# Patient Record
Sex: Female | Born: 1970 | Race: Black or African American | Hispanic: No | Marital: Single | Smoking: Never smoker
Health system: Southern US, Community
[De-identification: ages and names within clinical notes are randomized; demographics above are authoritative.]

## PROBLEM LIST (undated history)

## (undated) DIAGNOSIS — E0500 Thyrotoxicosis with diffuse goiter without thyrotoxic crisis or storm: Secondary | ICD-10-CM

## (undated) DIAGNOSIS — I10 Essential (primary) hypertension: Secondary | ICD-10-CM

## (undated) HISTORY — PX: DILATION AND CURETTAGE OF UTERUS: SHX78

## (undated) HISTORY — PX: BREAST REDUCTION SURGERY: SHX8

---

## 1999-11-24 ENCOUNTER — Emergency Department (HOSPITAL_COMMUNITY): Admission: EM | Admit: 1999-11-24 | Discharge: 1999-11-25 | Payer: Self-pay | Admitting: Emergency Medicine

## 1999-11-25 ENCOUNTER — Encounter: Payer: Self-pay | Admitting: Emergency Medicine

## 1999-12-07 ENCOUNTER — Ambulatory Visit (HOSPITAL_COMMUNITY): Admission: RE | Admit: 1999-12-07 | Discharge: 1999-12-07 | Payer: Self-pay | Admitting: Obstetrics and Gynecology

## 2001-02-17 ENCOUNTER — Emergency Department (HOSPITAL_COMMUNITY): Admission: EM | Admit: 2001-02-17 | Discharge: 2001-02-17 | Payer: Self-pay | Admitting: Emergency Medicine

## 2001-02-17 ENCOUNTER — Encounter: Payer: Self-pay | Admitting: Emergency Medicine

## 2002-12-20 ENCOUNTER — Emergency Department (HOSPITAL_COMMUNITY): Admission: EM | Admit: 2002-12-20 | Discharge: 2002-12-20 | Payer: Self-pay | Admitting: Emergency Medicine

## 2003-12-10 ENCOUNTER — Emergency Department (HOSPITAL_COMMUNITY): Admission: EM | Admit: 2003-12-10 | Discharge: 2003-12-10 | Payer: Self-pay | Admitting: Emergency Medicine

## 2005-10-01 ENCOUNTER — Emergency Department (HOSPITAL_COMMUNITY): Admission: EM | Admit: 2005-10-01 | Discharge: 2005-10-01 | Payer: Self-pay | Admitting: Emergency Medicine

## 2005-10-15 ENCOUNTER — Emergency Department (HOSPITAL_COMMUNITY): Admission: EM | Admit: 2005-10-15 | Discharge: 2005-10-16 | Payer: Self-pay | Admitting: Emergency Medicine

## 2007-05-06 ENCOUNTER — Emergency Department (HOSPITAL_COMMUNITY): Admission: EM | Admit: 2007-05-06 | Discharge: 2007-05-06 | Payer: Self-pay | Admitting: Emergency Medicine

## 2007-06-07 ENCOUNTER — Emergency Department (HOSPITAL_COMMUNITY): Admission: EM | Admit: 2007-06-07 | Discharge: 2007-06-07 | Payer: Self-pay | Admitting: Emergency Medicine

## 2008-05-11 ENCOUNTER — Encounter (HOSPITAL_COMMUNITY): Admission: RE | Admit: 2008-05-11 | Discharge: 2008-08-09 | Payer: Self-pay | Admitting: Internal Medicine

## 2008-11-08 ENCOUNTER — Emergency Department (HOSPITAL_COMMUNITY): Admission: EM | Admit: 2008-11-08 | Discharge: 2008-11-09 | Payer: Self-pay | Admitting: Emergency Medicine

## 2010-04-27 LAB — CBC
HCT: 42.3 % (ref 36.0–46.0)
Hemoglobin: 14.5 g/dL (ref 12.0–15.0)
MCHC: 34.2 g/dL (ref 30.0–36.0)
MCV: 83.4 fL (ref 78.0–100.0)
Platelets: 292 10*3/uL (ref 150–400)
RBC: 5.07 MIL/uL (ref 3.87–5.11)
RDW: 13 % (ref 11.5–15.5)
WBC: 10.8 10*3/uL — ABNORMAL HIGH (ref 4.0–10.5)

## 2010-04-27 LAB — URINALYSIS, ROUTINE W REFLEX MICROSCOPIC
Bilirubin Urine: NEGATIVE
Glucose, UA: NEGATIVE mg/dL
Nitrite: NEGATIVE
Specific Gravity, Urine: 1.028 (ref 1.005–1.030)
Urobilinogen, UA: 1 mg/dL (ref 0.0–1.0)
pH: 7 (ref 5.0–8.0)

## 2010-04-27 LAB — WET PREP, GENITAL
Trich, Wet Prep: NONE SEEN
Yeast Wet Prep HPF POC: NONE SEEN

## 2010-04-27 LAB — ABO/RH: ABO/RH(D): B POS

## 2010-04-27 LAB — GC/CHLAMYDIA PROBE AMP, GENITAL
Chlamydia, DNA Probe: NEGATIVE
GC Probe Amp, Genital: NEGATIVE

## 2010-04-27 LAB — POCT PREGNANCY, URINE: Preg Test, Ur: POSITIVE

## 2010-09-27 ENCOUNTER — Other Ambulatory Visit (HOSPITAL_COMMUNITY): Payer: Self-pay | Admitting: Obstetrics and Gynecology

## 2010-09-27 DIAGNOSIS — Z1231 Encounter for screening mammogram for malignant neoplasm of breast: Secondary | ICD-10-CM

## 2010-10-12 ENCOUNTER — Ambulatory Visit (HOSPITAL_COMMUNITY)
Admission: RE | Admit: 2010-10-12 | Discharge: 2010-10-12 | Disposition: A | Discharge status: Home / Self Care | Payer: BC Managed Care – PPO | Source: Ambulatory Visit | Attending: Obstetrics and Gynecology | Admitting: Obstetrics and Gynecology

## 2010-10-12 DIAGNOSIS — Z1231 Encounter for screening mammogram for malignant neoplasm of breast: Secondary | ICD-10-CM | POA: Insufficient documentation

## 2011-01-23 ENCOUNTER — Emergency Department (HOSPITAL_COMMUNITY)
Admission: EM | Admit: 2011-01-23 | Discharge: 2011-01-24 | Disposition: A | Discharge status: Home / Self Care | Payer: BC Managed Care – PPO | Attending: Emergency Medicine | Admitting: Emergency Medicine

## 2011-01-23 ENCOUNTER — Encounter: Payer: Self-pay | Admitting: *Deleted

## 2011-01-23 DIAGNOSIS — N939 Abnormal uterine and vaginal bleeding, unspecified: Secondary | ICD-10-CM

## 2011-01-23 DIAGNOSIS — Z79899 Other long term (current) drug therapy: Secondary | ICD-10-CM | POA: Insufficient documentation

## 2011-01-23 DIAGNOSIS — K625 Hemorrhage of anus and rectum: Secondary | ICD-10-CM | POA: Insufficient documentation

## 2011-01-23 DIAGNOSIS — N898 Other specified noninflammatory disorders of vagina: Secondary | ICD-10-CM | POA: Insufficient documentation

## 2011-01-23 DIAGNOSIS — I10 Essential (primary) hypertension: Secondary | ICD-10-CM | POA: Insufficient documentation

## 2011-01-23 HISTORY — DX: Essential (primary) hypertension: I10

## 2011-01-23 LAB — URINE MICROSCOPIC-ADD ON

## 2011-01-23 LAB — OCCULT BLOOD, POC DEVICE: Fecal Occult Bld: POSITIVE

## 2011-01-23 LAB — CBC
HCT: 34.3 % — ABNORMAL LOW (ref 36.0–46.0)
Hemoglobin: 11.6 g/dL — ABNORMAL LOW (ref 12.0–15.0)
MCHC: 33.8 g/dL (ref 30.0–36.0)
MCV: 79 fL (ref 78.0–100.0)
Platelets: 314 10*3/uL (ref 150–400)

## 2011-01-23 LAB — BASIC METABOLIC PANEL
BUN: 13 mg/dL (ref 6–23)
CO2: 25 mEq/L (ref 19–32)
Chloride: 105 mEq/L (ref 96–112)
GFR calc Af Amer: >90 mL/min (ref >90)
GFR calc non Af Amer: >90 mL/min (ref >90)
Glucose, Bld: 97 mg/dL (ref 70–99)
Potassium: 3.3 mEq/L — ABNORMAL LOW (ref 3.5–5.1)

## 2011-01-23 LAB — URINALYSIS, ROUTINE W REFLEX MICROSCOPIC
Bilirubin Urine: NEGATIVE
Glucose, UA: NEGATIVE mg/dL
Leukocytes, UA: NEGATIVE
Specific Gravity, Urine: 1.026 (ref 1.005–1.030)
pH: 6 (ref 5.0–8.0)

## 2011-01-23 LAB — PREGNANCY, URINE: Preg Test, Ur: POSITIVE

## 2011-01-23 LAB — HCG, QUANTITATIVE, PREGNANCY: hCG, Beta Chain, Quant, S: 2198 m[IU]/mL — ABNORMAL HIGH (ref <5)

## 2011-01-23 MED ORDER — MORPHINE SULFATE 4 MG/ML IJ SOLN
4.0000 mg | Freq: Once | INTRAMUSCULAR | Status: AC
  Administered 2011-01-23: 4 mg via INTRAMUSCULAR
  Filled 2011-01-23: qty 1

## 2011-01-23 MED ORDER — ONDANSETRON 8 MG PO TBDP
8.0000 mg | ORAL_TABLET | Freq: Once | ORAL | Status: AC
  Administered 2011-01-23: 8 mg via ORAL
  Filled 2011-01-23: qty 1

## 2011-01-23 NOTE — ED Provider Notes (Signed)
History     CSN: [id number]  Arrival date & time 01/23/11  2053   First MD Initiated Contact with Patient 01/23/11 2141      No chief complaint on file.  HPI Patient presents to the emergency room with complaint of dark stools for the past day. Patient reports that she recently had a D&C on Saturday for a threatened miscarriage. She was [redacted] weeks along. Patient reports that she has frequently been doing colon cleanses. Patient denies any other complaints including abdominal pain, nausea, vomiting, fevers, chills.   Past Medical History  Diagnosis Date  . Hypertension     Past Surgical History  Procedure Date  . Dilation and curettage of uterus     History reviewed. No pertinent family history.  History  Substance Use Topics  . Smoking status: Never Smoker   . Smokeless tobacco: Not on file  . Alcohol Use: Yes    OB History    Grav Para Term Preterm Abortions TAB SAB Ect Mult Living                  Review of Systems  Constitutional: Negative for fever, chills, diaphoresis and appetite change.  HENT: Negative for neck pain.   Eyes: Negative for photophobia and visual disturbance.  Respiratory: Negative for cough, chest tightness and shortness of breath.   Cardiovascular: Negative for chest pain.  Gastrointestinal: Positive for blood in stool. Negative for nausea, vomiting, abdominal pain, diarrhea, constipation, abdominal distention, anal bleeding and rectal pain.  Genitourinary: Positive for vaginal bleeding. Negative for dysuria, urgency, flank pain, difficulty urinating and vaginal pain.  Musculoskeletal: Negative for back pain.  Skin: Negative for rash.  Neurological: Negative for weakness and numbness.  All other systems reviewed and are negative.    Allergies  Review of patient's allergies indicates no known allergies.  Home Medications   Current Outpatient Rx  Name Route Sig Dispense Refill  . VITAMIN D 1000 UNITS PO TABS Oral Take 1,000 Units by mouth  daily.      Marland Kitchen MISOPROSTOL 200 MCG PO TABS Oral Take 200 mcg by mouth 4 (four) times daily.      Marland Kitchen NAPROXEN SODIUM 550 MG PO TABS Oral Take 550 mg by mouth 2 (two) times daily as needed. For pain.     Marland Kitchen OLMESARTAN-AMLODIPINE-HCTZ 40-10-25 MG PO TABS Oral Take 1 tablet by mouth daily.        BP 160/93  Pulse 77  Temp(Src) 98.9 F (37.2 C) (Oral)  Resp 18  SpO2 100%  Physical Exam  Nursing note and vitals reviewed. Constitutional: She is oriented to person, place, and time. She appears well-developed and well-nourished.  Non-toxic appearance. No distress.       Vital signs stable  HENT:  Head: Normocephalic and atraumatic.  Eyes: EOM are normal. Pupils are equal, round, and reactive to light.  Neck: Normal range of motion. Neck supple.  Cardiovascular: Normal rate, regular rhythm, normal heart sounds and intact distal pulses.  Exam reveals no gallop and no friction rub.   No murmur heard. Pulmonary/Chest: Effort normal and breath sounds normal. No respiratory distress. She has no wheezes.  Abdominal: Soft. Bowel sounds are normal. She exhibits no distension and no mass. There is no tenderness. There is no rebound and no guarding.  Genitourinary: Vagina normal. Guaiac positive stool.       Chaperone present during examination. Vaginal bleeding. No clots or products of conception. No adnexal tenderness. Normal brown color stool. No stool in  the rectal vault.   Musculoskeletal: Normal range of motion. She exhibits no edema and no tenderness.  Neurological: She is alert and oriented to person, place, and time. She exhibits normal muscle tone.  Skin: Skin is warm and dry. No rash noted. She is not diaphoretic.  Psychiatric: She has a normal mood and affect. Her behavior is normal. Judgment and thought content normal.    ED Course  Procedures (including critical care time)  Patient seen and evaluated.  VSS reviewed. . Nursing notes reviewed. Discussed with attending physician, dr. Patria Mane.  Initial testing ordered. Will monitor the patient closely. They agree with the treatment plan and diagnosis.   Results for orders placed during the hospital encounter of 01/23/11  URINALYSIS, ROUTINE W REFLEX MICROSCOPIC      Component Value Range   Color, Urine YELLOW  YELLOW    APPearance CLOUDY (*) CLEAR    Specific Gravity, Urine 1.026  1.005 - 1.030    pH 6.0  5.0 - 8.0    Glucose, UA NEGATIVE  NEGATIVE (mg/dL)   Hgb urine dipstick LARGE (*) NEGATIVE    Bilirubin Urine NEGATIVE  NEGATIVE    Ketones, ur TRACE (*) NEGATIVE (mg/dL)   Protein, ur NEGATIVE  NEGATIVE (mg/dL)   Urobilinogen, UA 0.2  0.0 - 1.0 (mg/dL)   Nitrite NEGATIVE  NEGATIVE    Leukocytes, UA NEGATIVE  NEGATIVE   CBC      Component Value Range   WBC 6.7  4.0 - 10.5 (K/uL)   RBC 4.34  3.87 - 5.11 (MIL/uL)   Hemoglobin 11.6 (*) 12.0 - 15.0 (g/dL)   HCT 95.6 (*) 21.3 - 46.0 (%)   MCV 79.0  78.0 - 100.0 (fL)   MCH 26.7  26.0 - 34.0 (pg)   MCHC 33.8  30.0 - 36.0 (g/dL)   RDW 08.6  57.8 - 46.9 (%)   Platelets 314  150 - 400 (K/uL)  BASIC METABOLIC PANEL      Component Value Range   Sodium 137  135 - 145 (mEq/L)   Potassium 3.3 (*) 3.5 - 5.1 (mEq/L)   Chloride 105  96 - 112 (mEq/L)   CO2 25  19 - 32 (mEq/L)   Glucose, Bld 97  70 - 99 (mg/dL)   BUN 13  6 - 23 (mg/dL)   Creatinine, Ser 6.29  0.50 - 1.10 (mg/dL)   Calcium 8.5  8.4 - 52.8 (mg/dL)   GFR calc non Af Amer >90  >90 (mL/min)   GFR calc Af Amer >90  >90 (mL/min)  PREGNANCY, URINE      Component Value Range   Preg Test, Ur POSITIVE    URINE MICROSCOPIC-ADD ON      Component Value Range   Squamous Epithelial / LPF MANY (*) RARE    WBC, UA 0-2  <3 (WBC/hpf)   RBC / HPF 0-2  <3 (RBC/hpf)   Bacteria, UA FEW (*) RARE    Urine-Other MUCOUS PRESENT    OCCULT BLOOD, POC DEVICE      Component Value Range   Fecal Occult Bld POSITIVE    HCG, QUANTITATIVE, PREGNANCY      Component Value Range   hCG, Beta Chain, Quant, S 2198 (*) <5 (mIU/mL)   No results  found.  Patient seen and re-evaluated. Resting comfortably. VSS stable. NAD. Patient notified of testing results. Stated agreement and understanding. Patient stated understanding to treatment plan and diagnosis.   Patient resting comfortably.  MDM  Rectal bleeding, most likely due  to the recent frequent colon cleanses. No abdominal pain, doubt acute abdominal process. No imaging needed at this time. Hemoglobin stable at this time. Advised follow up with her primary care physician, stated agreement and understanding. Vaginal bleeding patient advised to follow up with her gynecologist, for repeat hcg. Stated agreement and understanding. Advised of warning signs to return.         Demetrius Charity, PA 01/24/11 [phone]

## 2011-01-23 NOTE — ED Notes (Signed)
[id number]<AV> Expected date:<BR> Expected time:<BR> Means of arrival:<BR> Comments:<BR> For triage pt.

## 2011-01-23 NOTE — ED Notes (Signed)
Hemoccult card positive. Performed by Boneta Lucks, PA

## 2011-01-23 NOTE — ED Notes (Signed)
Pt seated c/o dark stool strted tonights/p d&c 4 days ago, still with mild vaginal bleeding

## 2011-01-23 NOTE — ED Notes (Signed)
Pt in c/o black tarry stools x1 today, s/p d and c on Saturday, also mild abd pain

## 2011-01-23 NOTE — ED Notes (Signed)
HCG blood already in lab. Please add

## 2011-01-23 NOTE — ED Notes (Signed)
Pt ambulating to the restroom. Will get labs when pt returns

## 2011-01-24 NOTE — Discharge Instructions (Signed)
Follow up with your gynecologist this week for further evaluation. Return to the ER if you develop the following:  SEEK IMMEDIATE MEDICAL CARE IF:   You develop severe or prolonged rectal bleeding.   You vomit blood.   You feel weak or faint.   You have a fever.   Bloody Stools Bloody stools often mean that there is a problem in the digestive tract. Your caregiver may use the term "melena" to describe black, tarry, and bad smelling stools or "hematochezia" to describe red or maroon-colored stools. Blood seen in the stool can be caused by bleeding anywhere along the intestinal tract.  A black stool usually means that blood is coming from the upper part of the gastrointestinal tract (esophagus, stomach, or small bowel). Passing maroon-colored stools or bright red blood usually means that blood is coming from lower down in the large bowel or the rectum. However, sometimes massive bleeding in the stomach or small intestine can cause bright red bloody stools.  Consuming black licorice, lead, iron pills, medicines containing bismuth subsalicylate, or blueberries can also cause black stools. Your caregiver can test black stools to see if blood is present. It is important that the cause of the bleeding be found. Treatment can then be started, and the problem can be corrected. Rectal bleeding may not be serious, but you should not assume everything is okay until you know the cause.It is very important to follow up with your caregiver or a specialist in gastrointestinal problems. CAUSES  Blood in the stools can come from various underlying causes.Often, the cause is not found during your first visit. Testing is often needed to discover the cause of bleeding in the gastrointestinal tract. Causes range from simple to serious or even life-threatening.Possible causes include:  Hemorrhoids.These are veins that are full of blood (engorged) in the rectum. They cause pain, inflammation, and may bleed.   Anal  fissures.These are areas of painful tearing which may bleed. They are often caused by passing hard stool.   Diverticulosis.These are pouches that form on the colon over time, with age, and may bleed significantly.   Diverticulitis.This is inflammation in areas with diverticulosis. It can cause pain, fever, and bloody stools, although bleeding is rare.   Proctitis and colitis. These are inflamed areas of the rectum or colon. They may cause pain, fever, and bloody stools.   Polyps and cancer. Colon cancer is a leading cause of preventable cancer death.It often starts out as precancerous polyps that can be removed during a colonoscopy, preventing progression into cancer. Sometimes, polyps and cancer may cause rectal bleeding.   Gastritis and ulcers.Bleeding from the upper gastrointestinal tract (near the stomach) may travel through the intestines and produce black, sometimes tarry, often bad smelling stools. In certain cases, if the bleeding is fast enough, the stools may not be black, but red and the condition may be life-threatening.  SYMPTOMS  You may have stools that are bright red and bloody, that are normal color with blood on them, or that are dark black and tarry. In some cases, you may only have blood in the toilet bowl. Any of these cases need medical care. You may also have:  Pain at the anus or anywhere in the rectum.   Lightheadedness or feeling faint.   Extreme weakness.   Nausea or vomiting.   Fever.  DIAGNOSIS Your caregiver may use the following methods to find the cause of your bleeding:  Taking a medical history. Age is important. Older people tend to  develop polyps and cancer more often. If there is anal pain and a hard, large stool associated with bleeding, a tear of the anus may be the cause. If blood drips into the toilet after a bowel movement, bleeding hemorrhoids may be the problem. The color and frequency of the bleeding are additional considerations. In most  cases, the medical history provides clues, but seldom the final answer.   A visual and finger (digital) exam. Your caregiver will inspect the anal area, looking for tears and hemorrhoids. A finger exam can provide information when there is tenderness or a growth inside. In men, the prostate is also examined.   Endoscopy. Several types of small, long scopes (endoscopes) are used to view the colon.   In the office, your caregiver may use a rigid, or more commonly, a flexible viewing sigmoidoscope. This exam is called flexible sigmoidoscopy. It is performed in 5 to 10 minutes.   A more thorough exam is accomplished with a colonoscope. It allows your caregiver to view the entire 5 to 6 foot long colon. Medicine to help you relax (sedative) is usually given for this exam. Frequently, a bleeding lesion may be present beyond the reach of the sigmoidoscope. So, a colonoscopy may be the best exam to start with. Both exams are usually done on an outpatient basis. This means the patient does not stay overnight in the hospital or surgery center.   An upper endoscopy may be needed to examine your stomach. Sedation is used and a flexible endoscope is put in your mouth, down to your stomach.   A barium enema X-ray. This is an X-ray exam. It uses liquid barium inserted by enema into the rectum. This test alone may not identify an actual bleeding point. X-rays highlight abnormal shadows, such as those made by lumps (tumors), diverticuli, or colitis.  TREATMENT  Treatment depends on the cause of your bleeding.   For bleeding from the stomach or colon, the caregiver doing your endoscopy or colonoscopy may be able to stop the bleeding as part of the procedure.   Inflammation or infection of the colon can be treated with medicines.   Many rectal problems can be treated with creams, suppositories, or warm baths.   Surgery is sometimes needed.   Blood transfusions are sometimes needed if you have lost a lot of  blood.   For any bleeding problem, let your caregiver know if you take aspirin or other blood thinners regularly.  HOME CARE INSTRUCTIONS   Take any medicines exactly as prescribed.   Keep your stools soft by eating a diet high in fiber. Prunes (1 to 3 a day) work well for many people.   Drink enough water and fluids to keep your urine clear or pale yellow.   Take sitz baths if advised. A sitz bath is when you sit in a bathtub with warm water for 10 to 15 minutes to soak, soothe, and cleanse the rectal area.   If enemas or suppositories are advised, be sure you know how to use them. Tell your caregiver if you have problems with this.   Monitor your bowel movements to look for signs of improvement or worsening.  SEEK MEDICAL CARE IF:   You do not improve in the time expected.   Your condition worsens after initial improvement.   You develop any new symptoms.  MAKE SURE YOU:  Understand these instructions.   Will watch your condition.   Will get help right away if you are not doing  well or get worse.  Document Released: 12/29/2001 Document Revised: 09/20/2010 Document Reviewed: 05/26/2010 [facility] Patient Information 2012 [place], [place].

## 2011-01-24 NOTE — ED Provider Notes (Signed)
Medical screening examination/treatment/procedure(s) were performed by non-physician practitioner and as supervising physician I was immediately available for consultation/collaboration.   Donavon Kimrey M Cagney Steenson, MD 01/24/11 0015 

## 2011-08-19 ENCOUNTER — Emergency Department (HOSPITAL_COMMUNITY)
Admission: EM | Admit: 2011-08-19 | Discharge: 2011-08-19 | Disposition: A | Discharge status: Home / Self Care | Payer: BC Managed Care – PPO | Attending: Emergency Medicine | Admitting: Emergency Medicine

## 2011-08-19 ENCOUNTER — Encounter (HOSPITAL_COMMUNITY): Payer: Self-pay | Admitting: *Deleted

## 2011-08-19 DIAGNOSIS — E0500 Thyrotoxicosis with diffuse goiter without thyrotoxic crisis or storm: Secondary | ICD-10-CM | POA: Insufficient documentation

## 2011-08-19 DIAGNOSIS — Z79899 Other long term (current) drug therapy: Secondary | ICD-10-CM | POA: Insufficient documentation

## 2011-08-19 DIAGNOSIS — I10 Essential (primary) hypertension: Secondary | ICD-10-CM | POA: Insufficient documentation

## 2011-08-19 DIAGNOSIS — H53149 Visual discomfort, unspecified: Secondary | ICD-10-CM | POA: Insufficient documentation

## 2011-08-19 DIAGNOSIS — R112 Nausea with vomiting, unspecified: Secondary | ICD-10-CM | POA: Insufficient documentation

## 2011-08-19 DIAGNOSIS — R51 Headache: Secondary | ICD-10-CM | POA: Insufficient documentation

## 2011-08-19 HISTORY — DX: Thyrotoxicosis with diffuse goiter without thyrotoxic crisis or storm: E05.00

## 2011-08-19 MED ORDER — METOCLOPRAMIDE HCL 5 MG/ML IJ SOLN
10.0000 mg | Freq: Once | INTRAMUSCULAR | Status: AC
  Administered 2011-08-19: 10 mg via INTRAMUSCULAR
  Filled 2011-08-19: qty 2

## 2011-08-19 MED ORDER — DIPHENHYDRAMINE HCL 50 MG/ML IJ SOLN
25.0000 mg | Freq: Once | INTRAMUSCULAR | Status: AC
  Administered 2011-08-19: 25 mg via INTRAMUSCULAR
  Filled 2011-08-19: qty 1

## 2011-08-19 NOTE — ED Notes (Signed)
Pt states "h/a started probably a little bit after MN, have some nausea, no vomiting, light hurts my eyes a little"

## 2011-08-19 NOTE — ED Provider Notes (Signed)
History     CSN: [id number]  Arrival date & time 08/19/11  7829   First MD Initiated Contact with Patient 08/19/11 1007      Chief Complaint  Patient presents with  . Headache    (Consider location/radiation/quality/duration/timing/severity/associated sxs/prior treatment) HPI Comments: Patient with history of Graves' disease, no substantial history of headaches --presents with onset of a constant dull frontal and occipital headache that began approximately midnight. Patient endorses photophobia and phonophobia. She has had some nausea without vomiting. She denies head injury. She denies weakness in her arms or her legs. She has not had trouble walking or trouble talking. She denies fever or neck pain/stiffness. No skin changes. No treatments prior to arrival. Onset was gradual. No thunderclap-type onset. Course is constant. Nothing has made symptoms better.   Patient is a 41 y.o. female presenting with headaches. The history is provided by the patient.  Headache  This is a new problem. The current episode started 6 to 12 hours ago. The problem occurs constantly. The problem has not changed since onset.The headache is associated with bright light. The pain is located in the frontal and occipital region. The quality of the pain is described as dull. The pain is moderate. The pain does not radiate. Associated symptoms include nausea. Pertinent negatives include no fever, no near-syncope, no palpitations, no shortness of breath and no vomiting. She has tried nothing for the symptoms. The treatment provided no relief.    Past Medical History  Diagnosis Date  . Hypertension   . Graves disease     Past Surgical History  Procedure Date  . Dilation and curettage of uterus     No family history on file.  History  Substance Use Topics  . Smoking status: Never Smoker   . Smokeless tobacco: Not on file  . Alcohol Use: Yes     rarely    OB History    Grav Para Term Preterm Abortions TAB  SAB Ect Mult Living                  Review of Systems  Constitutional: Negative for fever.  HENT: Negative for congestion, sore throat, rhinorrhea, neck pain, neck stiffness, dental problem and sinus pressure.   Eyes: Positive for photophobia. Negative for discharge, redness and visual disturbance.  Respiratory: Negative for cough and shortness of breath.   Cardiovascular: Negative for chest pain, palpitations and near-syncope.  Gastrointestinal: Positive for nausea. Negative for vomiting, abdominal pain and diarrhea.  Genitourinary: Negative for dysuria.  Musculoskeletal: Negative for myalgias and gait problem.  Skin: Negative for rash.  Neurological: Positive for headaches. Negative for syncope, facial asymmetry, speech difficulty, weakness, light-headedness and numbness.  Psychiatric/Behavioral: Negative for confusion.    Allergies  Review of patient's allergies indicates no known allergies.  Home Medications   Current Outpatient Rx  Name Route Sig Dispense Refill  . METHIMAZOLE 10 MG PO TABS Oral Take 10 mg by mouth See admin instructions. Take 2 tabs twice daily for 2 weeks, 3 tabs daily for 2 weeks, 2 tabs once a day    . OLMESARTAN-AMLODIPINE-HCTZ 40-10-25 MG PO TABS Oral Take 1 tablet by mouth daily.        BP 150/88  Pulse 63  Temp 98.1 F (36.7 C) (Oral)  Resp 16  Wt 150 lb (68.04 kg)  SpO2 100%  LMP 08/05/2011  Physical Exam  Nursing note and vitals reviewed. Constitutional: She is oriented to person, place, and time. She appears well-developed and well-nourished.  HENT:  Head: Normocephalic and atraumatic.  Right Ear: External ear normal.  Left Ear: External ear normal.  Eyes: Conjunctivae and EOM are normal. Pupils are equal, round, and reactive to light. Right eye exhibits no discharge. Left eye exhibits no discharge. Right conjunctiva is not injected. Left conjunctiva is not injected. Right eye exhibits normal extraocular motion and no nystagmus. Left eye  exhibits normal extraocular motion and no nystagmus.  Neck: Normal range of motion. Neck supple.       No meningeal signs  Cardiovascular: Normal rate, regular rhythm and normal heart sounds.   Pulmonary/Chest: Effort normal and breath sounds normal.  Abdominal: Soft. There is no tenderness.  Musculoskeletal: Normal range of motion.  Neurological: She is alert and oriented to person, place, and time. She has normal strength. No cranial nerve deficit or sensory deficit. She displays a negative Romberg sign. Coordination and gait normal.  Skin: Skin is warm and dry.  Psychiatric: She has a normal mood and affect.    ED Course  Procedures (including critical care time)  Labs Reviewed - No data to display No results found.   1. Headache     10:33 AM Patient seen and examined. Medications ordered. No evidence of trauma, no abnormal neurological findings.   Vital signs reviewed and are as follows: Filed Vitals:   08/19/11 0941  BP: 150/88  Pulse: 63  Temp: 98.1 F (36.7 C)  Resp: 16   Patient much improved after reglan and benadryl. Her neuro exam is unchanged.   Patient requests d/c home. Patient urged to return with worsening HA, confusion, vomiting. Husband given return instructions as well. Patient verbalizes understanding and agrees with plan.    MDM  HA: No head injury. No meningeal signs or fever. No thunderclap HA. Neurological exam is completely normal and unchanged during time in ED. Her pain is improved with treatment. It is atypical in the fact that patient does not suffer from headaches. There is no indication for imaging at this time.         [place], [place] 08/19/11 [phone]

## 2011-08-19 NOTE — ED Notes (Signed)
Pt states headache started around midnight, denies hx of headaches, states been nauseous but denies vomiting, states sensitivity to light and sound. Pt has not taken any medication to help relieve headache.

## 2011-08-19 NOTE — ED Notes (Signed)
Escorted to discharge window, in no distress.

## 2011-08-19 NOTE — Discharge Instructions (Signed)
Please read and follow all provided instructions.  Your diagnoses today include:  1. Headache     Tests performed today include:  Vital signs. See below for your results today.   Medications:  In the Emergency Department you received:  Reglan - antinausea/headache medication  Benadryl - antihistamine to counteract potential side effects of reglan  Additional information:  Follow any educational materials contained in this packet.  You are having a headache. No specific cause was found today for your headache. It may have been a migraine or other cause of headache. Stress, anxiety, fatigue, and depression are common triggers for headaches.  Your headache today does not appear to be life-threatening or require hospitalization, but often the exact cause of headaches is not determined in the emergency department. Therefore, follow-up with your doctor is very important to find out what may have caused your headache and whether or not you need any further diagnostic testing or treatment.   Sometimes headaches can appear benign (not harmful), but then more serious symptoms can develop which should prompt an immediate re-evaluation by your doctor or the emergency department.  BE VERY CAREFUL not to take multiple medicines containing Tylenol (also called acetaminophen). Doing so can lead to an overdose which can damage your liver and cause liver failure and possibly death.   Follow-up instructions: Please follow-up with your primary care provider in the next 3 days for further evaluation of your symptoms. If you do not have a primary care doctor -- see below for referral information.   Return instructions:   Please return to the Emergency Department if you experience worsening symptoms.  Return if the medications do not resolve your headache, if it recurs, or if you have multiple episodes of vomiting or cannot keep down fluids.  Return if you have a change from the usual  headache.  RETURN IMMEDIATELY IF you:  Develop a sudden, severe headache  Develop confusion or become poorly responsive or faint  Develop a fever above 100.2F or problem breathing  Have a change in speech, vision, swallowing, or understanding  Develop new weakness, numbness, tingling, incoordination in your arms or legs  Have a seizure  Please return if you have any other emergent concerns.  Additional Information:  Your vital signs today were: BP 150/88  Pulse 63  Temp 98.1 F (36.7 C) (Oral)  Resp 16  Wt 150 lb (68.04 kg)  SpO2 100%  LMP 08/05/2011 If your blood pressure (BP) was elevated above 135/85 this visit, please have this repeated by your doctor within one month. -------------- No Primary Care Doctor Call [facility]  [phone] Other agencies that provide inexpensive medical care    [facility]  [phone]    [facility]  [phone]    [facility]  [phone]    [facility]  [phone]    [facility]  [phone]    [facility]  [phone] -------------- RESOURCE GUIDE:  Dental Problems  Patients with Medicaid: [facility] [phone] [address]                                            [phone] [address] Phone:  [phone]  Phone:  [phone]  If unable to pay or uninsured, contact:  [facility] or [facility]. to become qualified for the adult dental clinic.  Chronic Pain Problems Contact [facility]  [phone] Patients need to be referred by their primary care doctor.  Insufficient Money for Medicine Contact [employer]:  call "211" or [facility] [phone].  Psychological Services [facility]  [phone] [facility]  [phone] [facility]   [phone] (emergency services [phone])  Substance Abuse Resources [facility]  and Drug Services  [phone] [facility] [phone] [facility] [phone] Floydene Flock [phone] Residential & Outpatient Substance Abuse Program  [phone]  Abuse/Neglect [facility] Child Abuse Hotline 9041380242 [facility] Child Abuse Hotline 3348799521 (After Hours)  Emergency Shelter [facility] [phone]  Maternity Homes [facility] [phone] [facility] [phone]  [facility] Resources  [facility]     [employer]                          [facility] Dept. [address] [place]                       [address]      [address] [place] [address]  Blondell Reveal Phone:  [phone]                                   Phone:  [phone]                 Phone:  [phone]  [facility] Phone:  [phone]  [facility] Child Abuse Hotline [phone] [phone] (After Hours)

## 2011-08-19 NOTE — ED Notes (Signed)
Pt states "haven't taken anything for the h/a"

## 2011-08-22 NOTE — ED Provider Notes (Signed)
Medical screening examination/treatment/procedure(s) were performed by non-physician practitioner and as supervising physician I was immediately available for consultation/collaboration.  Aemilia Dedrick, MD 08/22/11 0005 

## 2011-09-03 ENCOUNTER — Ambulatory Visit (INDEPENDENT_AMBULATORY_CARE_PROVIDER_SITE_OTHER): Payer: BC Managed Care – PPO | Admitting: Family Medicine

## 2011-09-03 ENCOUNTER — Ambulatory Visit: Payer: BC Managed Care – PPO

## 2011-09-03 DIAGNOSIS — M549 Dorsalgia, unspecified: Secondary | ICD-10-CM

## 2011-09-03 DIAGNOSIS — T148XXA Other injury of unspecified body region, initial encounter: Secondary | ICD-10-CM

## 2011-09-03 DIAGNOSIS — M542 Cervicalgia: Secondary | ICD-10-CM

## 2011-09-03 DIAGNOSIS — R52 Pain, unspecified: Secondary | ICD-10-CM

## 2011-09-03 DIAGNOSIS — M25519 Pain in unspecified shoulder: Secondary | ICD-10-CM

## 2011-09-03 DIAGNOSIS — IMO0001 Reserved for inherently not codable concepts without codable children: Secondary | ICD-10-CM

## 2011-09-03 MED ORDER — NAPROXEN 500 MG PO TABS: 500.0000 mg | ORAL_TABLET | Freq: Two times a day (BID) | ORAL | Status: AC

## 2011-09-03 MED ORDER — TRAMADOL HCL 50 MG PO TABS
50.0000 mg | ORAL_TABLET | Freq: Three times a day (TID) | ORAL | Status: AC | PRN
Start: 2011-09-03 — End: 2011-09-13

## 2011-09-03 MED ORDER — METHOCARBAMOL 500 MG PO TABS: 500.0000 mg | ORAL_TABLET | Freq: Three times a day (TID) | ORAL | Status: AC
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
## 2011-09-05 ENCOUNTER — Telehealth: Payer: Self-pay

## 2011-09-05 MED ORDER — CYCLOBENZAPRINE HCL 5 MG PO TABS: 5.0000 mg | ORAL_TABLET | Freq: Three times a day (TID) | ORAL | Status: AC | PRN

## 2011-09-05 NOTE — Telephone Encounter (Signed)
I called patient to advise if she is having chest pain, she needs to come in for this.She states she only has this pain when she takes the Robaxin. She states she knows it is from this medication and does not need to come in for this issue, she only wants the medication changed. Please advise.

## 2011-09-05 NOTE — Telephone Encounter (Signed)
I will change the medication. But I have to advise if the patient is having chest pain they need to call 911.

## 2011-09-05 NOTE — Telephone Encounter (Signed)
Pt states that she was prescribed methocarbamol and it is causing her heart to hurt. Pt would like to have a different muscle relaxer if possible. Best# [phone] [facility]: [facility]

## 2011-09-05 NOTE — Telephone Encounter (Signed)
Notified pt that we have called in a different muscle relaxant for her, but again advised that provider stressed that if she is having chest pain she should call 911. Pt stated she is doing a little better, but that she does understand.

## 2013-01-12 ENCOUNTER — Other Ambulatory Visit (HOSPITAL_COMMUNITY): Payer: Self-pay | Admitting: Obstetrics and Gynecology

## 2013-01-12 DIAGNOSIS — Z1231 Encounter for screening mammogram for malignant neoplasm of breast: Secondary | ICD-10-CM

## 2013-01-28 ENCOUNTER — Ambulatory Visit (HOSPITAL_COMMUNITY)
Admission: RE | Admit: 2013-01-28 | Discharge: 2013-01-28 | Disposition: A | Discharge status: Home / Self Care | Payer: BC Managed Care – PPO | Source: Ambulatory Visit | Attending: Obstetrics and Gynecology | Admitting: Obstetrics and Gynecology

## 2013-01-28 DIAGNOSIS — Z1231 Encounter for screening mammogram for malignant neoplasm of breast: Secondary | ICD-10-CM | POA: Insufficient documentation

## 2014-04-29 ENCOUNTER — Encounter: Payer: BC Managed Care – PPO | Attending: Obstetrics and Gynecology | Admitting: Dietician

## 2014-04-29 ENCOUNTER — Encounter: Payer: Self-pay | Admitting: Dietician

## 2014-04-29 DIAGNOSIS — R7309 Other abnormal glucose: Secondary | ICD-10-CM | POA: Insufficient documentation

## 2014-04-29 DIAGNOSIS — R7303 Prediabetes: Secondary | ICD-10-CM

## 2014-04-29 DIAGNOSIS — Z713 Dietary counseling and surveillance: Secondary | ICD-10-CM | POA: Diagnosis not present

## 2014-04-29 NOTE — Progress Notes (Signed)
  Medical Nutrition Therapy:  Appt start time: 400 end time:  430.   Assessment:  Primary concerns today: [name] states that she is here to discuss recent diagnosis of prediabetes (HgbA1c 5.7%). 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 Does not sleep well, likely due to hyperthyroidism. She is from [place], moved to [place] in [place]. She lives alone. Does not go out to eat much. She may go to a [employer] restaurant 3x a week and drink a glass of sangria.  Preferred Learning Style:   No preference indicated   Learning Readiness:   Ready   MEDICATIONS: see list   DIETARY INTAKE:  Avoided foods include red meat, pasta.    24-hr recall:  B ( AM): oatmeal from [employer] with hashbrowns or fruit, orange juice   Snk ( AM): hot chocolate from [employer] with espresso  L ( PM): usually skips, sometimes Malawiturkey wrap Snk ( PM):  D ( PM): grilled salmon with vegetable OR chicken breast with cabbage Snk ( PM): sometimes popcorn or grapes or candy  Beverages: water, orange juice occasionally with breakfast, Sangria 3x a week  Usual physical activity: walking outside or gym, inconsistent  Estimated energy needs: 1600-1800 calories 180-200 g carbohydrates  Progress Towards Goal(s):  In progress.   Nutritional Diagnosis:  -2.2 Altered nutrition-related laboratory As related to excessive carbohydrate intake.  As evidenced by HgbA1c 5.7%.    Intervention:  Nutrition education provided. Explained glucose metabolism and identified carbohydrate foods. Encouraged the patient to eat regularly and pair carbs with a protein food.  Teaching Method Utilized: Visual Auditory Hands on  Handouts given during visit include:  MyPlate  Meal planning card  Snack list  Barriers to learning/adherence to lifestyle change: busy schedule  Demonstrated degree of understanding via:  Teach Back   Monitoring/Evaluation:  Dietary intake, exercise, labs, and body weight in 3 month(s).

## 2014-05-25 ENCOUNTER — Ambulatory Visit (INDEPENDENT_AMBULATORY_CARE_PROVIDER_SITE_OTHER): Payer: BC Managed Care – PPO | Admitting: Emergency Medicine

## 2014-05-25 VITALS — BP 142/98 | HR 115 | Temp 99.1°F | Resp 20 | Ht 64.5 in | Wt 162.6 lb

## 2014-05-25 DIAGNOSIS — J209 Acute bronchitis, unspecified: Secondary | ICD-10-CM | POA: Diagnosis not present

## 2014-05-25 DIAGNOSIS — I10 Essential (primary) hypertension: Secondary | ICD-10-CM | POA: Insufficient documentation

## 2014-05-25 MED ORDER — HYDROCODONE-ACETAMINOPHEN 5-325 MG PO TABS: 1.0000 | ORAL_TABLET | ORAL | Status: DC | PRN

## 2014-05-25 MED ORDER — AMOXICILLIN-POT CLAVULANATE 875-125 MG PO TABS
1.0000 | ORAL_TABLET | Freq: Two times a day (BID) | ORAL | Status: DC
Start: 2014-05-25 — End: 2017-02-25

## 2014-05-25 NOTE — Progress Notes (Signed)
[facility] [address], [place] [address] [phone]  Date:  05/25/2014   Name:  Amy Wheeler   DOB:  10-Mar-1970   MRN:  [id number]  PCP:  Pearson Grippe, MD    Chief Complaint: Wheezing; Cough; Ear Pain; and Chills   History of Present Illness:  Amy Wheeler is a 44 y.o. very pleasant female patient who presents with the following:  Ill since Saturday Developed headache.  No nasal congestion or drainage No visual symptoms.  Felt "off balance".  No falling. No fever or chills but feels "hot" Now has cough productive purulent sputum No wheezing or shortness of breath Malaise and fatigue Not able to work No nausea or vomiting No stool change No improvement with over the counter medications or other home remedies.  Denies other complaint or health concern today.   Patient Active Problem List   Diagnosis Date Noted  . Essential hypertension 05/25/2014    Past Medical History  Diagnosis Date  . Hypertension   . Graves disease     Past Surgical History  Procedure Laterality Date  . Dilation and curettage of uterus      History  Substance Use Topics  . Smoking status: Never Smoker   . Smokeless tobacco: Not on file  . Alcohol Use: Yes     Comment: rarely    History reviewed. No pertinent family history.  Allergies  Allergen Reactions  . Hydrocodone Itching    Medication list has been reviewed and updated.  Current Outpatient Prescriptions on File Prior to Visit  Medication Sig Dispense Refill  . Olmesartan-Amlodipine-HCTZ (TRIBENZOR) 40-10-25 MG TABS Take 1 tablet by mouth daily.      . Vitamin D, Ergocalciferol, (DRISDOL) 50000 UNITS CAPS capsule Take 50,000 Units by mouth every 7 (seven) days.    . methimazole (TAPAZOLE) 10 MG tablet Take 10 mg by mouth See admin instructions. Take 2 tabs twice daily for 2 weeks, 3 tabs daily for 2 weeks, 2 tabs once a day     No current facility-administered medications on file prior to visit.     Review of Systems:  Review of Systems  Constitutional: Negative for fever, chills and fatigue.  HENT: Negative for congestion, ear pain, hearing loss, postnasal drip, rhinorrhea and sinus pressure.   Eyes: Negative for discharge and redness.  Respiratory: Negative for cough, shortness of breath and wheezing.   Cardiovascular: Negative for chest pain and leg swelling.  Gastrointestinal: Negative for nausea, vomiting, abdominal pain, constipation and blood in stool.  Genitourinary: Negative for dysuria, urgency and frequency.  Musculoskeletal: Negative for neck stiffness.  Skin: Negative for rash.  Neurological: Negative for seizures, weakness and headaches.     Physical Examination: Filed Vitals:   05/25/14 0836  BP: 142/98  Pulse: 115  Temp: 99.1 F (37.3 C)  Resp: 20   Filed Vitals:   05/25/14 0836  Height: 5' 4.5" (1.638 m)  Weight: 162 lb 9.6 oz (73.755 kg)   Body mass index is 27.49 kg/(m^2). Ideal Body Weight: Weight in (lb) to have BMI = 25: 147.6  GEN: WDWN, NAD, Non-toxic, A & O x 3 HEENT: Atraumatic, Normocephalic. Neck supple. No masses, No LAD. Ears and Nose: No external deformity. CV: RRR, No M/G/R. No JVD. No thrill. No extra heart sounds. PULM: CTA B, no wheezes, crackles, rhonchi. No retractions. No resp. distress. No accessory muscle use. ABD: S, NT, ND, +BS. No rebound. No HSM. EXTR: No c/c/e NEURO Normal gait.  PSYCH: Normally interactive. Conversant. Not depressed or anxious appearing.  Calm demeanor.    Assessment and Plan: Bronchitis augmentin tussionex   Signed Phillips OdorJeffery , MD

## 2014-05-25 NOTE — Patient Instructions (Signed)

## 2014-08-02 ENCOUNTER — Ambulatory Visit: Payer: BC Managed Care – PPO | Admitting: Dietician

## 2014-08-16 ENCOUNTER — Encounter (HOSPITAL_COMMUNITY): Payer: Self-pay

## 2014-08-16 ENCOUNTER — Emergency Department (HOSPITAL_COMMUNITY)
Admission: EM | Admit: 2014-08-16 | Discharge: 2014-08-16 | Disposition: A | Discharge status: Home / Self Care | Payer: BC Managed Care – PPO | Attending: Physician Assistant | Admitting: Physician Assistant

## 2014-08-16 DIAGNOSIS — R000 Tachycardia, unspecified: Secondary | ICD-10-CM | POA: Diagnosis present

## 2014-08-16 DIAGNOSIS — Z8639 Personal history of other endocrine, nutritional and metabolic disease: Secondary | ICD-10-CM | POA: Diagnosis not present

## 2014-08-16 DIAGNOSIS — Z79899 Other long term (current) drug therapy: Secondary | ICD-10-CM | POA: Diagnosis not present

## 2014-08-16 DIAGNOSIS — Z3202 Encounter for pregnancy test, result negative: Secondary | ICD-10-CM | POA: Diagnosis not present

## 2014-08-16 DIAGNOSIS — I10 Essential (primary) hypertension: Secondary | ICD-10-CM | POA: Insufficient documentation

## 2014-08-16 LAB — URINALYSIS, ROUTINE W REFLEX MICROSCOPIC
Bilirubin Urine: NEGATIVE
Glucose, UA: NEGATIVE mg/dL
Ketones, ur: NEGATIVE mg/dL
Leukocytes, UA: NEGATIVE
Nitrite: NEGATIVE
PH: 6 (ref 5.0–8.0)
Protein, ur: NEGATIVE mg/dL
SPECIFIC GRAVITY, URINE: 1.005 (ref 1.005–1.030)
Urobilinogen, UA: 0.2 mg/dL (ref 0.0–1.0)

## 2014-08-16 LAB — BASIC METABOLIC PANEL
Anion gap: 8 (ref 5–15)
BUN: 13 mg/dL (ref 6–20)
CO2: 27 mmol/L (ref 22–32)
Calcium: 8.5 mg/dL — ABNORMAL LOW (ref 8.9–10.3)
Chloride: 104 mmol/L (ref 101–111)
Creatinine, Ser: 0.78 mg/dL (ref 0.44–1.00)
GFR calc Af Amer: >60 mL/min (ref >60)
Glucose, Bld: 168 mg/dL — ABNORMAL HIGH (ref 65–99)
POTASSIUM: 2.6 mmol/L — AB (ref 3.5–5.1)
Sodium: 139 mmol/L (ref 135–145)

## 2014-08-16 LAB — CBC WITH DIFFERENTIAL/PLATELET
BASOS PCT: 0 % (ref 0–1)
Basophils Absolute: 0 10*3/uL (ref 0.0–0.1)
EOS ABS: 0.1 10*3/uL (ref 0.0–0.7)
Eosinophils Relative: 2 % (ref 0–5)
HEMATOCRIT: 38 % (ref 36.0–46.0)
Hemoglobin: 12.9 g/dL (ref 12.0–15.0)
Lymphocytes Relative: 48 % — ABNORMAL HIGH (ref 12–46)
Lymphs Abs: 3.3 10*3/uL (ref 0.7–4.0)
MCH: 24.8 pg — ABNORMAL LOW (ref 26.0–34.0)
MCHC: 33.9 g/dL (ref 30.0–36.0)
MCV: 73.1 fL — ABNORMAL LOW (ref 78.0–100.0)
Monocytes Absolute: 0.5 10*3/uL (ref 0.1–1.0)
Monocytes Relative: 8 % (ref 3–12)
Neutro Abs: 2.8 10*3/uL (ref 1.7–7.7)
Neutrophils Relative %: 42 % — ABNORMAL LOW (ref 43–77)
Platelets: 258 10*3/uL (ref 150–400)
RBC: 5.2 MIL/uL — AB (ref 3.87–5.11)
RDW: 13.4 % (ref 11.5–15.5)
WBC: 6.7 10*3/uL (ref 4.0–10.5)

## 2014-08-16 LAB — URINE MICROSCOPIC-ADD ON

## 2014-08-16 LAB — PREGNANCY, URINE: Preg Test, Ur: NEGATIVE

## 2014-08-16 LAB — D-DIMER, QUANTITATIVE: D-Dimer, Quant: <0.27 ug/mL-FEU (ref 0.00–0.48)

## 2014-08-16 LAB — TROPONIN I: Troponin I: 0.03 ng/mL (ref <0.031)

## 2014-08-16 MED ORDER — POTASSIUM CHLORIDE CRYS ER 20 MEQ PO TBCR
40.0000 meq | EXTENDED_RELEASE_TABLET | Freq: Once | ORAL | Status: AC
  Administered 2014-08-16: 40 meq via ORAL
  Filled 2014-08-16: qty 2

## 2014-08-16 MED ORDER — ACETAMINOPHEN 325 MG PO TABS
650.0000 mg | ORAL_TABLET | Freq: Once | ORAL | Status: AC
  Administered 2014-08-16: 650 mg via ORAL
  Filled 2014-08-16: qty 2

## 2014-08-16 MED ORDER — SODIUM CHLORIDE 0.9 % IV BOLUS (SEPSIS)
1000.0000 mL | Freq: Once | INTRAVENOUS | Status: AC
  Administered 2014-08-16: 1000 mL via INTRAVENOUS

## 2014-08-16 NOTE — ED Notes (Signed)
Pt c/o hypertension and tachycardia starting this afternoon.  Denies pain.  Pt reports that she went for a pre-employment physical and was told that her BP was 205/109.  Hx of HTN and compliant.  Pt's HR was "around 100" at other office.

## 2014-08-16 NOTE — Discharge Instructions (Signed)
Nonspecific Tachycardia  Tachycardia is a faster than normal heartbeat (more than 100 beats per minute). In adults, the heart normally beats between 60 and 100 times a minute. A fast heartbeat may be a normal response to exercise or stress. It does not necessarily mean that something is wrong. However, sometimes when your heart beats too fast it may not be able to pump enough blood to the rest of your body. This can result in chest pain, shortness of breath, dizziness, and even fainting. Nonspecific tachycardia means that the specific cause or pattern of your tachycardia is unknown.  CAUSES   Tachycardia may be harmless or it may be due to a more serious underlying cause. Possible causes of tachycardia include:  · Exercise or exertion.  · Fever.  · Pain or injury.  · Infection.  · Loss of body fluids (dehydration).  · Overactive thyroid.  · Lack of red blood cells (anemia).  · Anxiety and stress.  · Alcohol.  · Caffeine.  · Tobacco products.  · Diet pills.  · Illegal drugs.  · Heart disease.  SYMPTOMS  · Rapid or irregular heartbeat (palpitations).  · Suddenly feeling your heart beating (cardiac awareness).  · Dizziness.  · Tiredness (fatigue).  · Shortness of breath.  · Chest pain.  · Nausea.  · Fainting.  DIAGNOSIS   Your caregiver will perform a physical exam and take your medical history. In some cases, a heart specialist (cardiologist) may be consulted. Your caregiver may also order:  · Blood tests.  · Electrocardiography. This test records the electrical activity of your heart.  · A heart monitoring test.  TREATMENT   Treatment will depend on the likely cause of your tachycardia. The goal is to treat the underlying cause of your tachycardia. Treatment methods may include:  · Replacement of fluids or blood through an intravenous (IV) tube for moderate to severe dehydration or anemia.  · New medicines or changes in your current medicines.  · Diet and lifestyle changes.  · Treatment for certain  infections.  · Stress relief or relaxation methods.  HOME CARE INSTRUCTIONS   · Rest.  · Drink enough fluids to keep your urine clear or pale yellow.  · Do not smoke.  · Avoid:  ¨ Caffeine.  ¨ Tobacco.  ¨ Alcohol.  ¨ Chocolate.  ¨ Stimulants such as over-the-counter diet pills or pills that help you stay awake.  ¨ Situations that cause anxiety or stress.  ¨ Illegal drugs such as marijuana, phencyclidine (PCP), and cocaine.  · Only take medicine as directed by your caregiver.  · Keep all follow-up appointments as directed by your caregiver.  SEEK IMMEDIATE MEDICAL CARE IF:   · You have pain in your chest, upper arms, jaw, or neck.  · You become weak, dizzy, or feel faint.  · You have palpitations that will not go away.  · You vomit, have diarrhea, or pass blood in your stool.  · Your skin is cool, pale, and wet.  · You have a fever that will not go away with rest, fluids, and medicine.  MAKE SURE YOU:   · Understand these instructions.  · Will watch your condition.  · Will get help right away if you are not doing well or get worse.  Document Released: 02/16/2004 Document Revised: 04/02/2011 Document Reviewed: 12/19/2010  ExitCare® Patient Information ©2015 [employer]. This information is not intended to replace advice given to you by your health care provider. Make sure you discuss any questions   you have with your health care provider.

## 2014-08-16 NOTE — ED Provider Notes (Addendum)
CSN: [id number]     Arrival date & time 08/16/14  1623 History   First MD Initiated Contact with Patient 08/16/14 1647     Chief Complaint  Patient presents with  . Hypertension  . Tachycardia     (Consider location/radiation/quality/duration/timing/severity/associated sxs/prior Treatment) HPI Comments: Patient is a 44 year old female presented today is a follow-up from her complete health screening earlier today. She went to employee health screening at [facility] and found to have a hypertension, blood pressure 205/145. Patient has history of hypertension and takes medication. Patient came here for second opinion. Here her blood pressure is controlled at 150 systolic but has heart rate in the 130s initially. Patient has been to several hospitals today including [facility] and then [facility] and then here. She reports spending a lot of time driving ad nbeing outside today (temperature is very elevated) and feels dehydrated. Patient has had no chest pain and no other concerns.  Patient is a 44 y.o. female presenting with hypertension.  Hypertension Pertinent negatives include no chest pain.    Past Medical History  Diagnosis Date  . Hypertension   . Graves disease    Past Surgical History  Procedure Laterality Date  . Dilation and curettage of uterus    . Breast reduction surgery     History reviewed. No pertinent family history. History  Substance Use Topics  . Smoking status: Never Smoker   . Smokeless tobacco: Not on file  . Alcohol Use: Yes     Comment: rarely   OB History    No data available     Review of Systems  Constitutional: Negative for activity change and fatigue.  HENT: Negative for congestion and drooling.   Eyes: Negative for discharge.  Respiratory: Negative for cough and chest tightness.   Cardiovascular: Negative for chest pain.  Gastrointestinal: Negative for abdominal distention.  Genitourinary: Negative for dysuria and  difficulty urinating.  Musculoskeletal: Negative for joint swelling.  Skin: Negative for rash.  Allergic/Immunologic: Negative for immunocompromised state.  Neurological: Negative for seizures and speech difficulty.  Psychiatric/Behavioral: Negative for behavioral problems and agitation.      Allergies  Hydrocodone  Home Medications   Prior to Admission medications   Medication Sig Start Date End Date Taking? Authorizing Provider  MULTIPLE VITAMIN PO Take 10 mLs by mouth daily.   Yes Historical Provider, MD  Olmesartan-Amlodipine-HCTZ (TRIBENZOR) 40-10-25 MG TABS Take 1 tablet by mouth daily.     Yes Historical Provider, MD  amoxicillin-clavulanate (AUGMENTIN) 875-125 MG per tablet Take 1 tablet by mouth 2 (two) times daily. Patient not taking: Reported on 08/16/2014 05/25/14   Carmelina Dane, MD  HYDROcodone-acetaminophen [facility]) 5-325 MG per tablet Take 1-2 tablets by mouth every 4 (four) hours as needed. Patient not taking: Reported on 08/16/2014 05/25/14   Carmelina Dane, MD   BP 158/92 mmHg  Pulse 116  Temp(Src) 98.9 F (37.2 C)  Resp 24  SpO2 100%  LMP 08/02/2014 Physical Exam  Constitutional: She is oriented to person, place, and time. She appears well-developed and well-nourished.  HENT:  Head: Normocephalic and atraumatic.  Eyes: Conjunctivae are normal. Right eye exhibits no discharge.  Neck: Neck supple.  Cardiovascular: Regular rhythm and normal heart sounds.   No murmur heard. Sinus tachycardia  Pulmonary/Chest: Effort normal and breath sounds normal. She has no wheezes. She has no rales.  Abdominal: Soft. She exhibits no distension. There is no tenderness.  Musculoskeletal: Normal range of motion. She exhibits no  edema.  Neurological: She is oriented to person, place, and time. No cranial nerve deficit.  Skin: Skin is warm and dry. No rash noted. She is not diaphoretic.  Psychiatric: She has a normal mood and affect. Her behavior is normal.  Nursing note  and vitals reviewed.   ED Course  Procedures (including critical care time) Labs Review Labs Reviewed - No data to display  Imaging Review No results found.   EKG Interpretation   Date/Time:  Monday August 16 2014 16:55:17 EDT Ventricular Rate:  117 PR Interval:  135 QRS Duration: 74 QT Interval:  250 QTC Calculation: 349 R Axis:   57 Text Interpretation:  Sinus tachycardia Biatrial enlargement Left  ventricular hypertrophy no acute ischmeia,  unchanged from prior Confirmed  by Kandis Mannan ([id number]) on 08/16/2014 5:08:47 PM Also confirmed by  Kandis Mannan ([id number])  on 08/16/2014 5:17:23 PM      MDM   Final diagnoses:  None    Patient is a healthy 44 year old female presenting today with tachycardia. Patient found to have high blood pressure at outside facility. Blood pressure now here under 160 systolic. Patient has good follow-up with her primary care provider about this blood pressure problem. She has no chest pain see no reason to do labs today we will give fluid. It is likely the patient just dehydrated from being outside in the hot sun.     Randall An, MD 08/16/14 1709   Lyn , MD 08/16/14 1717  8:42 PM Attempted to the lower patient's heart rate with 2 L of fluid. Still tachycardic. We'll workup for tachycardia including d-dimer CBC Chem-7 UA pregnancy test.  11:12 PM Patient admitted to doing a "cleanse" recently. After 3 L HR at 100.  Neg work up.  Will have her follow up with PCP.    Randall An, MD 08/16/14 2312

## 2014-08-18 LAB — URINE CULTURE

## 2015-12-14 ENCOUNTER — Other Ambulatory Visit: Payer: Self-pay | Admitting: Obstetrics and Gynecology

## 2015-12-14 DIAGNOSIS — R928 Other abnormal and inconclusive findings on diagnostic imaging of breast: Secondary | ICD-10-CM

## 2016-08-01 ENCOUNTER — Encounter: Payer: Self-pay | Admitting: *Deleted

## 2016-08-15 ENCOUNTER — Ambulatory Visit: Payer: BC Managed Care – PPO | Admitting: *Deleted

## 2016-08-15 DIAGNOSIS — I781 Nevus, non-neoplastic: Secondary | ICD-10-CM

## 2016-08-15 NOTE — Progress Notes (Signed)
Pt decided against treatment when we discussed use of the stockings (which I had gone over with her on the phone). She will call to resched in the fall. No charge for this visit.

## 2017-02-14 ENCOUNTER — Ambulatory Visit: Payer: BC Managed Care – PPO | Admitting: Family Medicine

## 2017-02-15 ENCOUNTER — Emergency Department (HOSPITAL_COMMUNITY)
Admission: EM | Admit: 2017-02-15 | Discharge: 2017-02-15 | Disposition: A | Discharge status: Home / Self Care | Payer: BLUE CROSS/BLUE SHIELD | Attending: Emergency Medicine | Admitting: Emergency Medicine

## 2017-02-15 ENCOUNTER — Encounter (HOSPITAL_COMMUNITY): Payer: Self-pay | Admitting: Emergency Medicine

## 2017-02-15 ENCOUNTER — Ambulatory Visit: Payer: Self-pay | Admitting: *Deleted

## 2017-02-15 ENCOUNTER — Emergency Department (HOSPITAL_COMMUNITY): Payer: BLUE CROSS/BLUE SHIELD

## 2017-02-15 DIAGNOSIS — R000 Tachycardia, unspecified: Secondary | ICD-10-CM | POA: Diagnosis not present

## 2017-02-15 DIAGNOSIS — Z5321 Procedure and treatment not carried out due to patient leaving prior to being seen by health care provider: Secondary | ICD-10-CM | POA: Insufficient documentation

## 2017-02-15 LAB — CBC
HEMATOCRIT: 41.9 % (ref 36.0–46.0)
HEMOGLOBIN: 14.4 g/dL (ref 12.0–15.0)
MCH: 26.4 pg (ref 26.0–34.0)
MCHC: 34.4 g/dL (ref 30.0–36.0)
MCV: 76.9 fL — ABNORMAL LOW (ref 78.0–100.0)
Platelets: 389 10*3/uL (ref 150–400)
RBC: 5.45 MIL/uL — ABNORMAL HIGH (ref 3.87–5.11)
RDW: 13.6 % (ref 11.5–15.5)
WBC: 5.1 10*3/uL (ref 4.0–10.5)

## 2017-02-15 LAB — BASIC METABOLIC PANEL
Anion gap: 9 (ref 5–15)
BUN: 12 mg/dL (ref 6–20)
CALCIUM: 9.4 mg/dL (ref 8.9–10.3)
CO2: 29 mmol/L (ref 22–32)
Chloride: 99 mmol/L — ABNORMAL LOW (ref 101–111)
Creatinine, Ser: 0.68 mg/dL (ref 0.44–1.00)
GFR calc Af Amer: >60 mL/min (ref >60)
GLUCOSE: 159 mg/dL — AB (ref 65–99)
POTASSIUM: 2.9 mmol/L — AB (ref 3.5–5.1)
Sodium: 137 mmol/L (ref 135–145)

## 2017-02-15 LAB — I-STAT TROPONIN, ED: Troponin i, poc: 0 ng/mL (ref 0.00–0.08)

## 2017-02-15 LAB — I-STAT BETA HCG BLOOD, ED (MC, WL, AP ONLY): I-stat hCG, quantitative: <5 m[IU]/mL (ref <5)

## 2017-02-15 NOTE — Telephone Encounter (Signed)
Will monitor chart for ED arrival.  

## 2017-02-15 NOTE — ED Notes (Signed)
Patient told staff he is leaving.

## 2017-02-15 NOTE — Telephone Encounter (Signed)
Pt reports HR this am "very fast and pounding." States she had been prescribed a Z-Pac at [facility] yesterday for congestion and sore throat. Took 2 tabs yesterday, 1 today; tachycardia began 30 minutes after dose.  Reports feeling "slightly" lightheaded.  Pt checked radial HR during call at rest, 124, after 10 minutes 104. Pt also checked B/P during call 193/111, after 5 minutes 182/109. Reports still feels "little" lightheaded and "winded."States she did take her B/P med this am but does not always take it daily. Pt instructed to go to ED. States she will get ride. Reason for Disposition . [1] Systolic BP  >= 160 OR Diastolic >= 100 AND [2] cardiac or neurologic symptoms (e.g., chest pain, difficulty breathing, unsteady gait, blurred vision)  Answer Assessment - Initial Assessment Questions 1. DESCRIPTION: "Please describe your heart rate or heart beat that you are having" (e.g., fast/slow, regular/irregular, skipped or extra beats, "palpitations")     HR checked during call, regular at 124 2. ONSET: "When did it start?" (Minutes, hours or days)      At 10-11am after taking 2nd day dose of Z-Pac 3. DURATION: "How long does it last" (e.g., seconds, minutes, hours)     Continues 4. PATTERN "Does it come and go, or has it been constant since it started?"  "Does it get worse with exertion?"   "Are you feeling it now?"     Constant 5. TAP: "Using your hand, can you tap out what you are feeling on a chair or table in front of you, so that I can hear?" (Note: not all patients can do this)        6. HEART RATE: "Can you tell me your heart rate?" "How many beats in 15 seconds?"  (Note: not all patients can do this)       124 7. RECURRENT SYMPTOM: "Have you ever had this before?" If so, ask: "When was the last time?" and "What happened that time?"      No 8. CAUSE: "What do you think is causing the palpitations?"    Unsure 9. CARDIAC HISTORY: "Do you have any history of heart disease?" (e.g., heart attack,  angina, bypass surgery, angioplasty, arrhythmia)     HTN 10. OTHER SYMPTOMS: "Do you have any other symptoms?" (e.g., dizziness, chest pain, sweating, difficulty breathing)       Mild "feel winded" Lightheaded when episode began. 11. PREGNANCY: "Is there any chance you are pregnant?" "When was your last menstrual period?"       no  Answer Assessment - Initial Assessment Questions 1. BLOOD PRESSURE: "What is the blood pressure?" "Did you take at least two measurements 5 minutes apart?"     193/111  After 5 minutes 182/109 2. ONSET: "When did you take your blood pressure?"     During triage call 3. HOW: "How did you obtain the blood pressure?" (e.g., visiting nurse, automatic home BP monitor)     Home monitor 4. HISTORY: "Do you have a history of high blood pressure?"     Yes 5. MEDICATIONS: "Are you taking any medications for blood pressure?" "Have you missed any doses recently?"     Yes, "May have missed doses." 6. OTHER SYMPTOMS: "Do you have any symptoms?" (e.g., headache, chest pain, blurred vision, difficulty breathing, weakness)     "Feel a little winded and lightheaded." 7. PREGNANCY: "Is there any chance you are pregnant?" "When was your last menstrual period?"     no  Protocols used: HIGH BLOOD PRESSURE-A-AH, HEART  RATE AND HEARTBEAT QUESTIONS-A-AH

## 2017-02-15 NOTE — ED Triage Notes (Signed)
Patient reports that she took z-pak medications this morning and half hour to hour later (between 11-1130am) started feeling her heart race. Patient left work and went home to lay down. Patient continued to have feeling so called urgent care and was told by nurse to check BP and HR and told was little elevated to come to ED. Patient HR in triage is 100-107 on heart monitor.

## 2017-02-15 NOTE — ED Notes (Signed)
Pt has decided the wait is too long for her, pt  States she will see her primary about her blood pressure.

## 2017-02-15 NOTE — Telephone Encounter (Signed)
Patient currently at [facility] ED. 

## 2017-02-25 ENCOUNTER — Encounter (HOSPITAL_COMMUNITY): Payer: Self-pay | Admitting: Emergency Medicine

## 2017-02-25 ENCOUNTER — Other Ambulatory Visit: Payer: Self-pay | Admitting: Obstetrics and Gynecology

## 2017-02-25 ENCOUNTER — Ambulatory Visit (HOSPITAL_COMMUNITY)
Admission: EM | Admit: 2017-02-25 | Discharge: 2017-02-25 | Disposition: A | Discharge status: Home / Self Care | Payer: BLUE CROSS/BLUE SHIELD | Attending: Family Medicine | Admitting: Family Medicine

## 2017-02-25 ENCOUNTER — Other Ambulatory Visit: Payer: Self-pay

## 2017-02-25 DIAGNOSIS — I10 Essential (primary) hypertension: Secondary | ICD-10-CM

## 2017-02-25 DIAGNOSIS — Z8639 Personal history of other endocrine, nutritional and metabolic disease: Secondary | ICD-10-CM | POA: Diagnosis not present

## 2017-02-25 DIAGNOSIS — R251 Tremor, unspecified: Secondary | ICD-10-CM | POA: Insufficient documentation

## 2017-02-25 DIAGNOSIS — Z885 Allergy status to narcotic agent status: Secondary | ICD-10-CM | POA: Insufficient documentation

## 2017-02-25 LAB — T4, FREE: Free T4: 4.53 ng/dL — ABNORMAL HIGH (ref 0.61–1.12)

## 2017-02-25 MED ORDER — POTASSIUM CHLORIDE ER 10 MEQ PO TBCR: 10.0000 meq | EXTENDED_RELEASE_TABLET | Freq: Two times a day (BID) | ORAL | 0 refills | Status: DC

## 2017-02-25 MED ORDER — METOPROLOL TARTRATE 100 MG PO TABS: 100.0000 mg | ORAL_TABLET | Freq: Two times a day (BID) | ORAL | 2 refills | Status: DC

## 2017-02-25 MED ORDER — LORAZEPAM 1 MG PO TABS: 0.5000 mg | ORAL_TABLET | Freq: Every evening | ORAL | 1 refills | Status: DC | PRN

## 2017-02-25 NOTE — Discharge Instructions (Signed)
Stop your current blood pressure medication because it is lowering her blood level of potassium.  Instead take the metoprolol that I prescribed for you along with replacement potassium.

## 2017-02-25 NOTE — ED Triage Notes (Signed)
Pt states "a couple of weeks ago I feel like I was getting strep throat, I got to the UC they gave me a z pack. Within about an hour my heart starts racing. Went home, my blood pressure was 190 something. My pulse was a little high, I went to the ER on the 25th, I was there for over four hours, they took blood work, the wait was too long, I think the z pack messed up my graves disease." Pt states she has graves disease, has been seeing an endocrinologist. Pt c/o insomnia, heart racing, off and on since she was given the z pack. Pt called her endocrinologist and he retired. Trying to get new endocrinologist.

## 2017-02-25 NOTE — ED Provider Notes (Addendum)
[facility]   [id number] 02/25/17 Arrival Time: 1530   SUBJECTIVE:  Amy Wheeler is a 47 y.o. female who presents to the urgent care with complaint of tremors and insomnia.  Changing his work patient has a history of hypothyroidism and hypertension.  She now feels like she did when she developed hypothyroidism.  Her skin is dry and itchy, she is shaking, her heart is racing, she feels hot, and she has insomnia.  Pt states "a couple of weeks ago I feel like I was getting strep throat, I got to the UC they gave me a z pack. Within about an hour my heart starts racing. Went home, my blood pressure was 190 something. My pulse was a little high, I went to the ER on the 25th, I was there for over four hours, they took blood work, the wait was too long, I think the z pack messed up my graves disease." Pt states she has graves disease, has been seeing an endocrinologist. Pt c/o insomnia, heart racing, off and on since she was given the z pack. Pt called her endocrinologist and he retired. Trying to get new endocrinologist.  Patient works at [employer].  Past Medical History:  Diagnosis Date  . Graves disease   . Hypertension    No family history on file. Social History   Socioeconomic History  . Marital status: Single    Spouse name: Not on file  . Number of children: Not on file  . Years of education: Not on file  . Highest education level: Not on file  Social Needs  . Financial resource strain: Not on file  . Food insecurity - worry: Not on file  . Food insecurity - inability: Not on file  . Transportation needs - medical: Not on file  . Transportation needs - non-medical: Not on file  Occupational History  . Not on file  Tobacco Use  . Smoking status: Never Smoker  Substance and Sexual Activity  . Alcohol use: Yes    Comment: rarely  . Drug use: No  . Sexual activity: Not on file  Other Topics Concern  . Not on file  Social History Narrative  . Not on file    Current Meds  Medication Sig  . BLACK CURRANT SEED OIL PO Take by mouth.  . Cholecalciferol (VITAMIN D PO) Take by mouth.  . EVENING PRIMROSE OIL PO Take by mouth.  . [DISCONTINUED] Olmesartan-Amlodipine-HCTZ (TRIBENZOR) 40-10-25 MG TABS Take 1 tablet by mouth daily.    . [DISCONTINUED] POTASSIUM PO Take by mouth.   Allergies  Allergen Reactions  . Hydrocodone Itching      ROS: As per HPI, remainder of ROS negative.   OBJECTIVE:   Vitals:   02/25/17 1608  BP: (!) 153/102  Pulse: (!) 105  Resp: 18  Temp: 99.3 F (37.4 C)  TempSrc: Oral  SpO2: 97%     General appearance: alert; no distress Eyes: PERRL; EOMI; conjunctiva normal HENT: normocephalic; atraumatic; TMs normal, canal normal, external ears normal without trauma; nasal mucosa normal; oral mucosa normal Neck: supple Lungs: clear to auscultation bilaterally Heart: regular rate and rhythm Back: no CVA tenderness Extremities: no cyanosis or edema; symmetrical with no gross deformities Skin: warm and dry Neurologic: normal gait; grossly normal Psychological: alert and cooperative; normal mood and affect      Labs:  Results for orders placed or performed during the hospital encounter of 02/15/17  Basic metabolic panel  Result Value Ref Range  Sodium 137 135 - 145 mmol/L   Potassium 2.9 (L) 3.5 - 5.1 mmol/L   Chloride 99 (L) 101 - 111 mmol/L   CO2 29 22 - 32 mmol/L   Glucose, Bld 159 (H) 65 - 99 mg/dL   BUN 12 6 - 20 mg/dL   Creatinine, Ser 6.210.68 0.44 - 1.00 mg/dL   Calcium 9.4 8.9 - 30.810.3 mg/dL   GFR calc non Af Amer >60 >60 mL/min   GFR calc Af Amer >60 >60 mL/min   Anion gap 9 5 - 15  CBC  Result Value Ref Range   WBC 5.1 4.0 - 10.5 K/uL   RBC 5.45 (H) 3.87 - 5.11 MIL/uL   Hemoglobin 14.4 12.0 - 15.0 g/dL   HCT 65.741.9 84.636.0 - 96.246.0 %   MCV 76.9 (L) 78.0 - 100.0 fL   MCH 26.4 26.0 - 34.0 pg   MCHC 34.4 30.0 - 36.0 g/dL   RDW 95.213.6 84.111.5 - 32.415.5 %   Platelets 389 150 - 400 K/uL  I-stat troponin,  ED  Result Value Ref Range   Troponin i, poc 0.00 0.00 - 0.08 ng/mL   Comment 3          I-Stat beta hCG blood, ED  Result Value Ref Range   I-stat hCG, quantitative <5.0 <5 mIU/mL   Comment 3            Labs Reviewed  TSH  T4, FREE  T3, FREE    No results found.     ASSESSMENT & PLAN:  1. Essential hypertension   2. Tremor   3. H/O hyperthyroidism     Meds ordered this encounter  Medications  . metoprolol tartrate (LOPRESSOR) 100 MG tablet    Sig: Take 1 tablet (100 mg total) by mouth 2 (two) times daily.    Dispense:  60 tablet    Refill:  2  . potassium chloride (K-DUR) 10 MEQ tablet    Sig: Take 1 tablet (10 mEq total) by mouth 2 (two) times daily.    Dispense:  30 tablet    Refill:  0  . LORazepam (ATIVAN) 1 MG tablet    Sig: Take 0.5 tablets (0.5 mg total) by mouth at bedtime as needed and may repeat dose one time if needed for anxiety.    Dispense:  15 tablet    Refill:  1    Reviewed expectations re: course of current medical issues. Questions answered. Outlined signs and symptoms indicating need for more acute intervention. Patient verbalized understanding. After Visit Summary given.      Elvina SidleLauenstein, Addisen Chappelle, MD 02/25/17 1627    Elvina SidleLauenstein, Bailee Thall, MD 02/25/17 1651

## 2017-02-26 LAB — T3, FREE: T3, Free: 17.4 pg/mL — ABNORMAL HIGH (ref 2.0–4.4)

## 2017-03-01 LAB — TSH: TSH: <0.01 u[IU]/mL — ABNORMAL LOW (ref 0.350–4.500)

## 2017-11-10 ENCOUNTER — Other Ambulatory Visit: Payer: Self-pay

## 2017-11-10 ENCOUNTER — Encounter (HOSPITAL_COMMUNITY): Payer: Self-pay | Admitting: Emergency Medicine

## 2017-11-10 ENCOUNTER — Observation Stay (HOSPITAL_COMMUNITY)
Admission: EM | Admit: 2017-11-10 | Discharge: 2017-11-11 | Disposition: A | Discharge status: Home / Self Care | Payer: No Typology Code available for payment source | Attending: Family Medicine | Admitting: Family Medicine

## 2017-11-10 DIAGNOSIS — N179 Acute kidney failure, unspecified: Secondary | ICD-10-CM | POA: Insufficient documentation

## 2017-11-10 DIAGNOSIS — Z79899 Other long term (current) drug therapy: Secondary | ICD-10-CM | POA: Diagnosis not present

## 2017-11-10 DIAGNOSIS — T783XXA Angioneurotic edema, initial encounter: Principal | ICD-10-CM | POA: Diagnosis present

## 2017-11-10 DIAGNOSIS — E876 Hypokalemia: Secondary | ICD-10-CM | POA: Diagnosis not present

## 2017-11-10 DIAGNOSIS — E0500 Thyrotoxicosis with diffuse goiter without thyrotoxic crisis or storm: Secondary | ICD-10-CM | POA: Diagnosis not present

## 2017-11-10 DIAGNOSIS — X58XXXA Exposure to other specified factors, initial encounter: Secondary | ICD-10-CM | POA: Insufficient documentation

## 2017-11-10 DIAGNOSIS — I10 Essential (primary) hypertension: Secondary | ICD-10-CM | POA: Diagnosis not present

## 2017-11-10 DIAGNOSIS — Z885 Allergy status to narcotic agent status: Secondary | ICD-10-CM | POA: Diagnosis not present

## 2017-11-10 DIAGNOSIS — T7840XA Allergy, unspecified, initial encounter: Secondary | ICD-10-CM

## 2017-11-10 LAB — BASIC METABOLIC PANEL
Anion gap: 9 (ref 5–15)
BUN: 25 mg/dL — AB (ref 6–20)
CHLORIDE: 104 mmol/L (ref 98–111)
CO2: 28 mmol/L (ref 22–32)
CREATININE: 1.15 mg/dL — AB (ref 0.44–1.00)
Calcium: 8.9 mg/dL (ref 8.9–10.3)
GFR calc Af Amer: >60 mL/min (ref >60)
GFR calc non Af Amer: 56 mL/min — ABNORMAL LOW (ref >60)
GLUCOSE: 169 mg/dL — AB (ref 70–99)
POTASSIUM: 3.1 mmol/L — AB (ref 3.5–5.1)
SODIUM: 141 mmol/L (ref 135–145)

## 2017-11-10 LAB — PHOSPHORUS: Phosphorus: 1.9 mg/dL — ABNORMAL LOW (ref 2.5–4.6)

## 2017-11-10 LAB — CBC WITH DIFFERENTIAL/PLATELET
ABS IMMATURE GRANULOCYTES: 0.02 10*3/uL (ref 0.00–0.07)
Basophils Absolute: 0 10*3/uL (ref 0.0–0.1)
Basophils Relative: 0 %
EOS PCT: 1 %
Eosinophils Absolute: 0.1 10*3/uL (ref 0.0–0.5)
HEMATOCRIT: 40.3 % (ref 36.0–46.0)
HEMOGLOBIN: 13.1 g/dL (ref 12.0–15.0)
Immature Granulocytes: 0 %
LYMPHS ABS: 3.1 10*3/uL (ref 0.7–4.0)
LYMPHS PCT: 42 %
MCH: 26.3 pg (ref 26.0–34.0)
MCHC: 32.5 g/dL (ref 30.0–36.0)
MCV: 80.8 fL (ref 80.0–100.0)
MONO ABS: 0.6 10*3/uL (ref 0.1–1.0)
Monocytes Relative: 8 %
NEUTROS ABS: 3.5 10*3/uL (ref 1.7–7.7)
Neutrophils Relative %: 49 %
Platelets: 359 10*3/uL (ref 150–400)
RBC: 4.99 MIL/uL (ref 3.87–5.11)
RDW: 14.6 % (ref 11.5–15.5)
WBC: 7.2 10*3/uL (ref 4.0–10.5)
nRBC: 0 % (ref 0.0–0.2)

## 2017-11-10 LAB — MAGNESIUM: Magnesium: 1.9 mg/dL (ref 1.7–2.4)

## 2017-11-10 LAB — MRSA PCR SCREENING: MRSA BY PCR: NEGATIVE

## 2017-11-10 MED ORDER — ACETAMINOPHEN 650 MG RE SUPP: 650.0000 mg | Freq: Four times a day (QID) | RECTAL | Status: DC | PRN

## 2017-11-10 MED ORDER — HYDRALAZINE HCL 20 MG/ML IJ SOLN
10.0000 mg | Freq: Four times a day (QID) | INTRAMUSCULAR | Status: DC | PRN
  Administered 2017-11-11: 10 mg via INTRAVENOUS
  Filled 2017-11-10: qty 1

## 2017-11-10 MED ORDER — ALBUTEROL SULFATE (2.5 MG/3ML) 0.083% IN NEBU: 2.5000 mg | INHALATION_SOLUTION | Freq: Four times a day (QID) | RESPIRATORY_TRACT | Status: DC | PRN

## 2017-11-10 MED ORDER — SENNOSIDES-DOCUSATE SODIUM 8.6-50 MG PO TABS: 1.0000 | ORAL_TABLET | Freq: Every evening | ORAL | Status: DC | PRN

## 2017-11-10 MED ORDER — FAMOTIDINE IN NACL 20-0.9 MG/50ML-% IV SOLN
20.0000 mg | Freq: Two times a day (BID) | INTRAVENOUS | Status: DC
  Administered 2017-11-10 – 2017-11-11 (×2): 20 mg via INTRAVENOUS
  Filled 2017-11-10 (×2): qty 50

## 2017-11-10 MED ORDER — BISACODYL 5 MG PO TBEC: 5.0000 mg | DELAYED_RELEASE_TABLET | Freq: Every day | ORAL | Status: DC | PRN

## 2017-11-10 MED ORDER — IPRATROPIUM BROMIDE 0.02 % IN SOLN: 0.5000 mg | Freq: Four times a day (QID) | RESPIRATORY_TRACT | Status: DC | PRN

## 2017-11-10 MED ORDER — SODIUM CHLORIDE 0.9 % IV SOLN
INTRAVENOUS | Status: DC
  Administered 2017-11-10: 20:00:00 via INTRAVENOUS

## 2017-11-10 MED ORDER — METHYLPREDNISOLONE SODIUM SUCC 40 MG IJ SOLR
40.0000 mg | Freq: Two times a day (BID) | INTRAMUSCULAR | Status: DC
  Administered 2017-11-11: 40 mg via INTRAVENOUS
  Filled 2017-11-10: qty 1

## 2017-11-10 MED ORDER — METHYLPREDNISOLONE SODIUM SUCC 125 MG IJ SOLR
125.0000 mg | Freq: Once | INTRAMUSCULAR | Status: AC
  Administered 2017-11-10: 125 mg via INTRAVENOUS
  Filled 2017-11-10: qty 2

## 2017-11-10 MED ORDER — FAMOTIDINE IN NACL 20-0.9 MG/50ML-% IV SOLN
20.0000 mg | Freq: Once | INTRAVENOUS | Status: AC
  Administered 2017-11-10: 20 mg via INTRAVENOUS
  Filled 2017-11-10: qty 50

## 2017-11-10 MED ORDER — ONDANSETRON HCL 4 MG/2ML IJ SOLN: 4.0000 mg | Freq: Four times a day (QID) | INTRAMUSCULAR | Status: DC | PRN

## 2017-11-10 MED ORDER — POTASSIUM CHLORIDE 10 MEQ/100ML IV SOLN
10.0000 meq | INTRAVENOUS | Status: AC
  Administered 2017-11-10 – 2017-11-11 (×3): 10 meq via INTRAVENOUS
  Filled 2017-11-10 (×3): qty 100

## 2017-11-10 MED ORDER — MAGNESIUM CITRATE PO SOLN: 1.0000 | Freq: Once | ORAL | Status: DC | PRN

## 2017-11-10 MED ORDER — ACETAMINOPHEN 325 MG PO TABS: 650.0000 mg | ORAL_TABLET | Freq: Four times a day (QID) | ORAL | Status: DC | PRN

## 2017-11-10 MED ORDER — ONDANSETRON HCL 4 MG PO TABS: 4.0000 mg | ORAL_TABLET | Freq: Four times a day (QID) | ORAL | Status: DC | PRN

## 2017-11-10 MED ORDER — DIPHENHYDRAMINE HCL 50 MG/ML IJ SOLN
25.0000 mg | Freq: Four times a day (QID) | INTRAMUSCULAR | Status: DC
  Administered 2017-11-10 – 2017-11-11 (×3): 25 mg via INTRAVENOUS
  Filled 2017-11-10 (×3): qty 1

## 2017-11-10 NOTE — ED Triage Notes (Signed)
Pt BIB [facility] from home for anaphylactic reaction to gummy bears. Pt experienced swelling of tongue, throat, hives all over. Lung sonds clear, no sob. Epi 0.3 given by fire dept x1 with no relief again by EMS x1 0.3 as well as 50mg  benadryl with improvement. Pt states she feels better and is able to talk more.

## 2017-11-10 NOTE — H&P (Signed)
History and Physical   [facility] [place] [facility] Admission History and Physical [employer], D.O.    Patient Name: Amy Wheeler MR#: [id number] Date of Birth: [birth year] Date of Admission: 11/10/2017  Referring MD/NP/PA: Dr. Fredderick Phenix Primary Care Physician: Pearson Grippe, MD  Chief Complaint:  Chief Complaint  Patient presents with  . Allergic Reaction    HPI: Amy Wheeler is a 47 y.o. female with a known history of hypertension presents to the emergency department for evaluation of allergic reaction.  Patient was in a usual state of health until 1 hour prior to arrival when she developed a sensation of heat in the left side of her face and posterior pharynx associated with swelling of her tongue, difficulty swallowing, itching and redness to the left face.  She states that she has had no new medication exposures although she is taking triple therapy medication for blood pressure including an ARB amlodipine and hydrochlorothiazide though she has been on those medications for several years.  The only new food exposure is gummy bears which appear to be from another country..  Patient denies fevers/chills, weakness, dizziness, chest pain, shortness of breath, N/V/C/D, abdominal pain, dysuria/frequency, changes in mental status.    Otherwise there has been no change in status. Patient has been taking medication as prescribed and there has been no recent change in medication or diet.  No recent antibiotics.  There has been no recent illness, hospitalizations, travel or sick contacts.    EMS/ED Course: Patient received intramuscular epinephrine and 50 mill grams of Benadryl prior to arrival by EMS.  In addition she received Solu-Medrol and Pepcid in the emergency department.  She feels significant improvement in her symptoms.   Medical admission has been requested for further management of angioedema of the tongue.  Review of Systems:  CONSTITUTIONAL: No fever/chills,  fatigue, weakness, weight gain/loss, headache. EYES: No blurry or double vision. ENT: Positive tongue swelling no tinnitus, postnasal drip, redness or soreness of the oropharynx. RESPIRATORY: No cough, dyspnea, wheeze.  No hemoptysis.  CARDIOVASCULAR: No chest pain, palpitations, syncope, orthopnea. No lower extremity edema.  GASTROINTESTINAL: No nausea, vomiting, abdominal pain, diarrhea, constipation.  No hematemesis, melena or hematochezia. GENITOURINARY: No dysuria, frequency, hematuria. ENDOCRINE: No polyuria or nocturia. No heat or cold intolerance. HEMATOLOGY: No anemia, bruising, bleeding. INTEGUMENTARY: No rashes, ulcers, lesions. MUSCULOSKELETAL: No arthritis, gout, dyspnea. NEUROLOGIC: No numbness, tingling, ataxia, seizure-type activity, weakness. PSYCHIATRIC: No anxiety, depression, insomnia.   Past Medical History:  Diagnosis Date  . Graves disease   . Hypertension     Past Surgical History:  Procedure Laterality Date  . BREAST REDUCTION SURGERY    . DILATION AND CURETTAGE OF UTERUS       reports that she has never smoked. She has never used smokeless tobacco. She reports that she drinks alcohol. She reports that she does not use drugs.  Allergies  Allergen Reactions  . Hydrocodone Itching    Liquid Solution    History reviewed. No pertinent family history.  Prior to Admission medications   Medication Sig Start Date End Date Taking? Authorizing Provider  BLACK CURRANT SEED OIL PO Take 5 mLs by mouth daily.    Yes [provider]  HYDROcodone-acetaminophen (NORCO/VICODIN) 5-325 MG tablet Take 1 tablet by mouth every 6 (six) hours as needed. for pain 09/13/17  Yes [provider]  ibuprofen (ADVIL,MOTRIN) 800 MG tablet Take 800 mg by mouth every 6 (six) hours as needed for moderate pain.  09/13/17  Yes  [provider]  Olmesartan-amLODIPine-HCTZ (TRIBENZOR) 40-10-12.5 MG TABS Take 1 tablet by mouth daily.   Yes [provider]   LORazepam (ATIVAN) 1 MG tablet Take 0.5 tablets (0.5 mg total) by mouth at bedtime as needed and may repeat dose one time if needed for anxiety. Patient not taking: Reported on 11/10/2017 02/25/17   Elvina Sidle, MD  metoprolol tartrate (LOPRESSOR) 100 MG tablet Take 1 tablet (100 mg total) by mouth 2 (two) times daily. Patient not taking: Reported on 11/10/2017 02/25/17   Elvina Sidle, MD  potassium chloride (K-DUR) 10 MEQ tablet Take 1 tablet (10 mEq total) by mouth 2 (two) times daily. Patient not taking: Reported on 11/10/2017 02/25/17   Elvina Sidle, MD    Physical Exam: Vitals:   11/10/17 1556 11/10/17 1617 11/10/17 1734 11/10/17 1830  BP: (!) 156/109  (!) 161/93 (!) 160/99  Pulse: 75  78 70  Resp: 18  (!) 22 19  Temp: 98.3 F (36.8 C)     TempSrc: Oral     SpO2: 99%  98% 97%  Weight:  77.1 kg    Height:  5\' 4"  (1.626 m)      GENERAL: 47 y.o.-year-old female patient, well-developed, well-nourished lying in the bed in no acute distress.  Pleasant and cooperative.   HEENT: Head atraumatic, normocephalic. Pupils equal. Mucus membranes moist.  Diffuse swelling of the tongue.  Lips are mildly swollen NECK: Supple. No JVD. CHEST: Normal breath sounds bilaterally. No wheezing, rales, rhonchi or crackles. No use of accessory muscles of respiration.  No reproducible chest wall tenderness.  CARDIOVASCULAR: S1, S2 normal. No murmurs, rubs, or gallops. Cap refill <2 seconds. Pulses intact distally.  ABDOMEN: Soft, nondistended, nontender. No rebound, guarding, rigidity. Normoactive bowel sounds present in all four quadrants.  EXTREMITIES: No pedal edema, cyanosis, or clubbing. No calf tenderness or Homan's sign.  NEUROLOGIC: The patient is alert and oriented x 3. Cranial nerves II through XII are grossly intact with no focal sensorimotor deficit. PSYCHIATRIC:  Normal affect, mood, thought content. SKIN: Warm, dry, and intact without obvious rash, lesion, or ulcer.    Labs on  Admission:  CBC: Recent Labs  Lab 11/10/17 1620  WBC 7.2  NEUTROABS 3.5  HGB 13.1  HCT 40.3  MCV 80.8  PLT 359   Basic Metabolic Panel: Recent Labs  Lab 11/10/17 1620  NA 141  K 3.1*  CL 104  CO2 28  GLUCOSE 169*  BUN 25*  CREATININE 1.15*  CALCIUM 8.9   GFR: Estimated Creatinine Clearance: 60.8 mL/min (A) (by C-G formula based on SCr of 1.15 mg/dL (H)). Liver Function Tests: No results for input(s): AST, ALT, ALKPHOS, BILITOT, PROT, ALBUMIN in the last 168 hours. No results for input(s): LIPASE, AMYLASE in the last 168 hours. No results for input(s): AMMONIA in the last 168 hours. Coagulation Profile: No results for input(s): INR, PROTIME in the last 168 hours. Cardiac Enzymes: No results for input(s): CKTOTAL, CKMB, CKMBINDEX, TROPONINI in the last 168 hours. BNP (last 3 results) No results for input(s): PROBNP in the last 8760 hours. HbA1C: No results for input(s): HGBA1C in the last 72 hours. CBG: No results for input(s): GLUCAP in the last 168 hours. Lipid Profile: No results for input(s): CHOL, HDL, LDLCALC, TRIG, CHOLHDL, LDLDIRECT in the last 72 hours. Thyroid Function Tests: No results for input(s): TSH, T4TOTAL, FREET4, T3FREE, THYROIDAB in the last 72 hours. Anemia Panel: No results for input(s): VITAMINB12, FOLATE, FERRITIN, TIBC, IRON, RETICCTPCT in the last 72  hours. Urine analysis:    Component Value Date/Time   COLORURINE YELLOW 08/16/2014 2044   APPEARANCEUR CLEAR 08/16/2014 2044   LABSPEC 1.005 08/16/2014 2044   PHURINE 6.0 08/16/2014 2044   GLUCOSEU NEGATIVE 08/16/2014 2044   HGBUR TRACE (A) 08/16/2014 2044   BILIRUBINUR NEGATIVE 08/16/2014 2044   KETONESUR NEGATIVE 08/16/2014 2044   PROTEINUR NEGATIVE 08/16/2014 2044   UROBILINOGEN 0.2 08/16/2014 2044   NITRITE NEGATIVE 08/16/2014 2044   LEUKOCYTESUR NEGATIVE 08/16/2014 2044   Sepsis Labs: @LABRCNTIP (procalcitonin:4,lacticidven:4) )No results found for this or any previous visit  (from the past 240 hour(s)).   Radiological Exams on Admission: No results found.  Assessment/Plan  This is a 47 y.o. female with a history of HTN now being admitted with:  #.  Angioedema of the tongue possibly secondary to new food exposure versus antihypertensive medications Admit to stepdown for monitoring IV Solu-Medrol, Pepcid, Benadryl N.p.o. for now  #.  Mild hypokalemia Replace IV  #. Acute kidney injury  - IV fluids and repeat BMP in AM.  - Avoid nephrotoxic medications  #. History of hypertension - Continue to monitor for now.  Consider change of therapy  Admission status: Stepdown, observation IV Fluids: Normal saline Diet/Nutrition: N.p.o. Consults called: None DVT Px: SCDs and early ambulation. Code Status: Full Code  Disposition Plan: To home in 1-2 days  All the records are reviewed and case discussed with ED provider. Management plans discussed with the patient and/or family who express understanding and agree with plan of care.    D.O. on 11/10/2017 at 7:12 PM CC: Primary care physician; Pearson Grippe, MD   11/10/2017, 7:12 PM

## 2017-11-10 NOTE — ED Notes (Signed)
ED TO INPATIENT HANDOFF REPORT  Name/Age/Gender Amy Wheeler 47 y.o. female  Code Status   Home/SNF/Other Home  Chief Complaint Allergic Reaction   Level of Care/Admitting Diagnosis ED Disposition    ED Disposition Condition [facility]: [place] [100102]  Level of Care: Stepdown [14]  Admit to SDU based on following criteria: Other see comments  Comments: Angioedema, for close monitoring  Diagnosis: Angioedema [026378]  Admitting Physician: Harvie Bridge [[id number]]  Attending Physician: Harvie Bridge [[id number]]  PT Class (Do Not Modify): Observation [104]  PT Acc Code (Do Not Modify): Observation [10022]       Medical History Past Medical History:  Diagnosis Date  . Graves disease   . Hypertension     Allergies Allergies  Allergen Reactions  . Hydrocodone Itching    Liquid Solution    IV Location/Drains/Wounds Patient Lines/Drains/Airways Status   Active Line/Drains/Airways    Name:   Placement date:   Placement time:   Site:   Days:   Peripheral IV 08/16/14 Left Antecubital   08/16/14    1721    Antecubital   1182   Peripheral IV 11/10/17 Left Antecubital   11/10/17    1551    Antecubital   less than 1          Labs/Imaging Results for orders placed or performed during the hospital encounter of 11/10/17 (from the past 48 hour(s))  Basic metabolic panel     Status: Abnormal   Collection Time: 11/10/17  4:20 PM  Result Value Ref Range   Sodium 141 135 - 145 mmol/L   Potassium 3.1 (L) 3.5 - 5.1 mmol/L   Chloride 104 98 - 111 mmol/L   CO2 28 22 - 32 mmol/L   Glucose, Bld 169 (H) 70 - 99 mg/dL   BUN 25 (H) 6 - 20 mg/dL   Creatinine, Ser 1.15 (H) 0.44 - 1.00 mg/dL   Calcium 8.9 8.9 - 10.3 mg/dL   GFR calc non Af Amer 56 (L) >60 mL/min   GFR calc Af Amer >60 >60 mL/min    Comment: (NOTE) The eGFR has been calculated using the CKD EPI equation. This calculation has not been validated in all clinical  situations. eGFR's persistently <60 mL/min signify possible Chronic Kidney Disease.    Anion gap 9 5 - 15    Comment: Performed at [facility], [place] [address] [place], [place] [address]  CBC with Differential     Status: None   Collection Time: 11/10/17  4:20 PM  Result Value Ref Range   WBC 7.2 4.0 - 10.5 K/uL   RBC 4.99 3.87 - 5.11 MIL/uL   Hemoglobin 13.1 12.0 - 15.0 g/dL   HCT 40.3 36.0 - 46.0 %   MCV 80.8 80.0 - 100.0 fL   MCH 26.3 26.0 - 34.0 pg   MCHC 32.5 30.0 - 36.0 g/dL   RDW 14.6 11.5 - 15.5 %   Platelets 359 150 - 400 K/uL    Comment: RESULT REPEATED AND VERIFIED SPECIMEN CHECKED FOR CLOTS PLATELET COUNT CONFIRMED BY SMEAR    nRBC 0.0 0.0 - 0.2 %   Neutrophils Relative % 49 %   Neutro Abs 3.5 1.7 - 7.7 K/uL   Lymphocytes Relative 42 %   Lymphs Abs 3.1 0.7 - 4.0 K/uL   Monocytes Relative 8 %   Monocytes Absolute 0.6 0.1 - 1.0 K/uL   Eosinophils Relative 1 %   Eosinophils Absolute 0.1 0.0 -  0.5 K/uL   Basophils Relative 0 %   Basophils Absolute 0.0 0.0 - 0.1 K/uL   Immature Granulocytes 0 %   Abs Immature Granulocytes 0.02 0.00 - 0.07 K/uL    Comment: Performed at [facility], [place] [address], [place], [place] [address]   No results found. None  Pending Labs [employer] (From admission, onward)    Start     Ordered   Signed and Held  HIV antibody (Routine Testing)  Once,   R     Signed and Held   Signed and Held  Magnesium  Add-on,   R     Signed and Held   Signed and Held  Phosphorus  Add-on,   R     Signed and Held   Signed and Occupational hygienist morning,   R     Signed and Held   Signed and Held  CBC  Tomorrow morning,   R     Signed and Held   Signed and Held  Magnesium  Once,   R     Signed and Held          Vitals/Pain Today's Vitals   11/10/17 1556 11/10/17 1617 11/10/17 1734 11/10/17 1830  BP: (!) 156/109  (!) 161/93 (!) 160/99  Pulse: 75  78 70  Resp: 18  (!) 22 19   Temp: 98.3 F (36.8 C)     TempSrc: Oral     SpO2: 99%  98% 97%  Weight:  77.1 kg    Height:  _0  (1.626 m)    PainSc: 0-No pain 0-No pain      Isolation Precautions No active isolations  Medications Medications  methylPREDNISolone sodium succinate (SOLU-MEDROL) 125 mg/2 mL injection 125 mg (125 mg Intravenous Given 11/10/17 1612)  famotidine (PEPCID) IVPB 20 mg premix (0 mg Intravenous Stopped 11/10/17 1727)    Mobility walks

## 2017-11-10 NOTE — ED Provider Notes (Signed)
[facility]-EMERGENCY DEPT Provider Note   CSN: [id number] Arrival date & time: 11/10/17  1532     History   Chief Complaint Chief Complaint  Patient presents with  . Allergic Reaction    HPI Cianna Kasparian is a 47 y.o. female.  Patient is a 47 year old female who presents with allergic reaction.  She states about an hour ago she started having redness and itching to her face with swelling of her tongue.  History is limited due to her difficulty speaking due to the tongue swelling.  She denies any new exposures other than she was eating some gummy bears when this happened.  No history of similar allergic reactions in the past.  She denies any known insect bites.  No new medications.  She is not on ACE inhibitor.  She was given 2 shots of IM epinephrine and 50 mg of Benadryl prior to arrival by EMS.  She does feel like the tongue swelling has improved but she still has a hard time talking and breathing.     Past Medical History:  Diagnosis Date  . Graves disease   . Hypertension     Patient Active Problem List   Diagnosis Date Noted  . Essential hypertension 05/25/2014    Past Surgical History:  Procedure Laterality Date  . BREAST REDUCTION SURGERY    . DILATION AND CURETTAGE OF UTERUS       OB History   None      Home Medications    Prior to Admission medications   Medication Sig Start Date End Date Taking? Authorizing Provider  BLACK CURRANT SEED OIL PO Take 5 mLs by mouth daily.    Yes [provider]  HYDROcodone-acetaminophen (NORCO/VICODIN) 5-325 MG tablet Take 1 tablet by mouth every 6 (six) hours as needed. for pain 09/13/17  Yes [provider]  ibuprofen (ADVIL,MOTRIN) 800 MG tablet Take 800 mg by mouth every 6 (six) hours as needed for moderate pain.  09/13/17  Yes [provider]  Olmesartan-amLODIPine-HCTZ (TRIBENZOR) 40-10-12.5 MG TABS Take 1 tablet by mouth daily.   Yes [provider]  LORazepam  (ATIVAN) 1 MG tablet Take 0.5 tablets (0.5 mg total) by mouth at bedtime as needed and may repeat dose one time if needed for anxiety. Patient not taking: Reported on 11/10/2017 02/25/17   Elvina Sidle, MD  metoprolol tartrate (LOPRESSOR) 100 MG tablet Take 1 tablet (100 mg total) by mouth 2 (two) times daily. Patient not taking: Reported on 11/10/2017 02/25/17   Elvina Sidle, MD  potassium chloride (K-DUR) 10 MEQ tablet Take 1 tablet (10 mEq total) by mouth 2 (two) times daily. Patient not taking: Reported on 11/10/2017 02/25/17   Elvina Sidle, MD    Family History History reviewed. No pertinent family history.  Social History Social History   Tobacco Use  . Smoking status: Never Smoker  . Smokeless tobacco: Never Used  Substance Use Topics  . Alcohol use: Yes    Comment: rarely  . Drug use: No     Allergies   Hydrocodone   Review of Systems Review of Systems  Constitutional: Negative for chills, diaphoresis, fatigue and fever.  HENT: Positive for facial swelling. Negative for congestion, rhinorrhea and sneezing.   Eyes: Negative.   Respiratory: Positive for shortness of breath. Negative for cough and chest tightness.   Cardiovascular: Negative for chest pain and leg swelling.  Gastrointestinal: Negative for abdominal pain, blood in stool, diarrhea, nausea and vomiting.  Genitourinary: Negative for difficulty  urinating, flank pain, frequency and hematuria.  Musculoskeletal: Negative for arthralgias and back pain.  Skin: Positive for rash.  Neurological: Negative for dizziness, speech difficulty, weakness, numbness and headaches.     Physical Exam Updated Vital Signs BP (!) 161/93 (BP Location: Right Arm)   Pulse 78   Temp 98.3 F (36.8 C) (Oral)   Resp (!) 22   Ht 5\' 4"  (1.626 m)   Wt 77.1 kg   SpO2 98%   BMI 29.18 kg/m   Physical Exam  Constitutional: She is oriented to person, place, and time. She appears well-developed and well-nourished.  HENT:    Head: Normocephalic and atraumatic.  Positive diffuse swelling to the tongue and lips.  No stridor  Eyes: Pupils are equal, round, and reactive to light.  Neck: Normal range of motion. Neck supple.  Cardiovascular: Normal rate, regular rhythm and normal heart sounds.  Pulmonary/Chest: Effort normal and breath sounds normal. No respiratory distress. She has no wheezes. She has no rales. She exhibits no tenderness.  Abdominal: Soft. Bowel sounds are normal. There is no tenderness. There is no rebound and no guarding.  Musculoskeletal: Normal range of motion. She exhibits no edema.  Lymphadenopathy:    She has no cervical adenopathy.  Neurological: She is alert and oriented to person, place, and time.  Skin: Skin is warm and dry. Rash noted.  Psychiatric: She has a normal mood and affect.     ED Treatments / Results  Labs (all labs ordered are listed, but only abnormal results are displayed) Labs Reviewed  BASIC METABOLIC PANEL - Abnormal; Notable for the following components:      Result Value   Potassium 3.1 (*)    Glucose, Bld 169 (*)    BUN 25 (*)    Creatinine, Ser 1.15 (*)    GFR calc non Af Amer 56 (*)    All other components within normal limits  CBC WITH DIFFERENTIAL/PLATELET    EKG None  Radiology No results found.  Procedures Procedures (including critical care time)  Medications Ordered in ED Medications  methylPREDNISolone sodium succinate (SOLU-MEDROL) 125 mg/2 mL injection 125 mg (125 mg Intravenous Given 11/10/17 1612)  famotidine (PEPCID) IVPB 20 mg premix (0 mg Intravenous Stopped 11/10/17 1727)     Initial Impression / Assessment and Plan / ED Course  I have reviewed the triage vital signs and the nursing notes.  Pertinent labs & imaging results that were available during my care of the patient were reviewed by me and considered in my medical decision making (see chart for details).   Pt with anaphylaxis.  She was given 2 epinephrines prior to  arrival and 50 mg of Benadryl.  Was given Solu-Medrol and Pepcid on arrival to the ED.  16:13 still noting slow improvement, but still has significant tongue swelling  17:15 still improving, but still notes swelling to tongue, looks much better on exam  18:22 will plan admit to obs due to angioedema.  I spoke with Dr. Emmit Pomfret who will admit the pt. Labs reviewed.  She has some mild hypokalemia and hyperglycemia. CRITICAL CARE Performed by: Rolan Bucco Total critical care time: 45 minutes Critical care time was exclusive of separately billable procedures and treating other patients. Critical care was necessary to treat or prevent imminent or life-threatening deterioration. Critical care was time spent personally by me on the following activities: development of treatment plan with patient and/or surrogate as well as nursing, discussions with consultants, evaluation of patient's response to treatment, examination  of patient, obtaining history from patient or surrogate, ordering and performing treatments and interventions, ordering and review of laboratory studies, ordering and review of radiographic studies, pulse oximetry and re-evaluation of patient's condition.   Final Clinical Impressions(s) / ED Diagnoses   Final diagnoses:  Angioedema, initial encounter  Allergic reaction, initial encounter    ED Discharge Orders    None       Rolan Bucco, MD 11/10/17 1826

## 2017-11-11 DIAGNOSIS — N179 Acute kidney failure, unspecified: Secondary | ICD-10-CM | POA: Diagnosis not present

## 2017-11-11 DIAGNOSIS — T783XXD Angioneurotic edema, subsequent encounter: Secondary | ICD-10-CM | POA: Diagnosis not present

## 2017-11-11 DIAGNOSIS — I10 Essential (primary) hypertension: Secondary | ICD-10-CM

## 2017-11-11 LAB — BASIC METABOLIC PANEL
Anion gap: 8 (ref 5–15)
BUN: 15 mg/dL (ref 6–20)
CO2: 23 mmol/L (ref 22–32)
CREATININE: 0.77 mg/dL (ref 0.44–1.00)
Calcium: 8.9 mg/dL (ref 8.9–10.3)
Chloride: 108 mmol/L (ref 98–111)
GFR calc non Af Amer: >60 mL/min (ref >60)
GLUCOSE: 136 mg/dL — AB (ref 70–99)
POTASSIUM: 3.9 mmol/L (ref 3.5–5.1)
SODIUM: 139 mmol/L (ref 135–145)

## 2017-11-11 LAB — CBC
HCT: 39.9 % (ref 36.0–46.0)
Hemoglobin: 12.9 g/dL (ref 12.0–15.0)
MCH: 26.3 pg (ref 26.0–34.0)
MCHC: 32.3 g/dL (ref 30.0–36.0)
MCV: 81.3 fL (ref 80.0–100.0)
Platelets: 363 10*3/uL (ref 150–400)
RBC: 4.91 MIL/uL (ref 3.87–5.11)
RDW: 14.7 % (ref 11.5–15.5)
WBC: 12.5 10*3/uL — ABNORMAL HIGH (ref 4.0–10.5)
nRBC: 0 % (ref 0.0–0.2)

## 2017-11-11 LAB — HIV ANTIBODY (ROUTINE TESTING W REFLEX): HIV SCREEN 4TH GENERATION: NONREACTIVE

## 2017-11-11 MED ORDER — HYDROCHLOROTHIAZIDE 12.5 MG PO CAPS: 12.5000 mg | ORAL_CAPSULE | Freq: Every day | ORAL | 0 refills | Status: DC

## 2017-11-11 MED ORDER — HYDROCHLOROTHIAZIDE 12.5 MG PO CAPS
12.5000 mg | ORAL_CAPSULE | Freq: Every day | ORAL | Status: DC
  Administered 2017-11-11: 12.5 mg via ORAL
  Filled 2017-11-11: qty 1

## 2017-11-11 MED ORDER — AMLODIPINE BESYLATE 10 MG PO TABS
10.0000 mg | ORAL_TABLET | Freq: Every day | ORAL | Status: DC
  Administered 2017-11-11: 10 mg via ORAL
  Filled 2017-11-11: qty 1

## 2017-11-11 MED ORDER — METOPROLOL TARTRATE 100 MG PO TABS: 100.0000 mg | ORAL_TABLET | Freq: Two times a day (BID) | ORAL | 0 refills | Status: DC

## 2017-11-11 MED ORDER — EPINEPHRINE 0.3 MG/0.3ML IJ SOAJ: 0.3000 mg | Freq: Once | INTRAMUSCULAR | 0 refills | Status: AC

## 2017-11-11 MED ORDER — AMLODIPINE BESYLATE 10 MG PO TABS: 10.0000 mg | ORAL_TABLET | Freq: Every day | ORAL | 0 refills | Status: DC

## 2017-11-11 MED ORDER — METOPROLOL TARTRATE 25 MG PO TABS
100.0000 mg | ORAL_TABLET | Freq: Two times a day (BID) | ORAL | Status: DC
  Administered 2017-11-11: 100 mg via ORAL
  Filled 2017-11-11 (×2): qty 4

## 2017-11-11 MED ORDER — PREDNISONE 20 MG PO TABS: 20.0000 mg | ORAL_TABLET | Freq: Every day | ORAL | 0 refills | Status: AC

## 2017-11-11 NOTE — Discharge Instructions (Signed)
Angioedema Angioedema is the sudden swelling of tissue in the body. Angioedema can affect any part of the body, but it most often affects the deeper parts of the skin, causing red, itchy patches (hives) to appear over the affected area. It often begins during the night and is found in the morning. Depending on the cause, angioedema may happen:  Only once.  Several times. It may come back in unpredictable patterns.  Repeatedly for several years. Over time, it may gradually stop coming back.  Angioedema can be life-threatening if it affects the air passages that you breathe through. What are the causes? This condition may be caused by:  Foods, such as milk, eggs, shellfish, wheat, or nuts.  Certain medicines, such as ACE inhibitors, antibiotics, nonsteroidal anti-inflammatory drugs, birth control pills, or dyes used in X-rays.  Insect stings.  Infections.  Angioedema can be inherited, and episodes can be triggered by:  Mild injury.  Dental work.  Surgery.  Stress.  Sudden changes in temperature.  Exercise.  In some cases, the cause of this condition is not known. What are the signs or symptoms? Symptoms of this condition depend on where the swelling happens. Symptoms may include:  Swollen skin.  Red, itchy patches of skin (hives).  Redness in the affected area.  Pain in the affected area.  Swollen lips or tongue.  Wheezing.  Breathing problems.  If your internal organs are involved, symptoms may also include:  Nausea.  Abdominal pain.  Vomiting.  Difficulty swallowing.  Difficulty passing urine.  How is this diagnosed? This condition may be diagnosed based on:  An exam of the affected area.  Your medical history.  Whether anyone in your family has had this condition before.  A review of any medicines you have been taking.  Tests, including: ? Allergy skin tests to see if the condition was caused by an allergic reaction. ? Blood tests to see  if the condition was caused by a gene. ? Tests to check for underlying diseases that could cause the condition.  How is this treated? Treatment for this condition depends on the cause. It may involve any of the following:  If something triggered the condition, making changes to keep it from triggering the condition again.  If the condition affects your breathing, having tubes placed in your airway to keep it open.  Taking medicines to treat symptoms or prevent future episodes. These may include: ? Antihistamines. ? Epinephrine injections. ? Steroids.  If your condition is severe, you may need to be treated at the hospital. Angioedema usually gets better in 24-48 hours. Follow these instructions at home:  Take over-the-counter and prescription medicines only as told by your health care provider.  If you were given medicines for emergency allergy treatment, always carry them with you.  Wear a medical bracelet as told by your health care provider.  If something triggers your condition, avoid the trigger, if possible.  If your condition is inherited and you are thinking about having children, talk to your health care provider. It is important to discuss the risks of passing on the condition to your children. Contact a health care provider if:  You have repeated episodes of angioedema.  Episodes of angioedema start to happen more often than they used to, even after you take steps to prevent them.  You have episodes of angioedema that are more severe than they have been before, even after you take steps to prevent them.  You are thinking about having children. Get   help right away if:  You have severe swelling of your mouth, tongue, or lips.  You have trouble breathing.  You have trouble swallowing.  You faint. This information is not intended to replace advice given to you by your health care provider. Make sure you discuss any questions you have with your health care  provider. Document Released: 03/19/2001 Document Revised: 08/06/2015 Document Reviewed: 07/19/2015 Elsevier Interactive Patient Education  2018 [employer]  

## 2017-11-11 NOTE — Discharge Summary (Signed)
Physician Discharge Summary  Amy Wheeler  ZOX:[id number]  DOB: 09/11/1970  DOA: 11/10/2017 PCP: Pearson Grippe, MD  Admit date: 11/10/2017 Discharge date: 11/11/2017  Admitted From: Home  Disposition:  Home   Recommendations for Outpatient Follow-up:  1. Follow up with PCP in 1 week  2. Please obtain BMP/CBC in one week.   Discharge Condition: Stable CODE STATUS: Full code  Diet recommendation: Heart Healthy   Brief/Interim Summary: For full details see H&P/Progress note, but in brief, Amy Wheeler is a 47 year old female with known history of hypertension presented to the emergency department for evaluation of an allergic reaction.  Patient all of a sudden developed a heat sensation of her left side face and posterior pharynx associated with swelling of her tongue, difficulty swallowing, itching and hives on her back.  Patient reported no exposure to the medication, taking Tribenzor for hypertension for over many years.  Patient report not taking this medication as indicated, she has been using it only when her blood pressure is high.  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  The only new food exposure was gummy bears which appeared to be from another country.  No insect bite or animal exposure.  Upon ED evaluation patient received IM epinephrine, Benadryl, Solu-Medrol and Pepcid.  Has significant improvement in her symptoms and she was observed overnight to monitor angioedema of the tongue.  Patient was placed on Solu-Medrol twice daily, swelling significantly decreased, now is currently asymptomatic tolerating diet well and no difficulty with breathing or itchiness.  Patient was restarted on amlodipine and hydrochlorothiazide, ARB was discontinued.  Also was found to have AKI which was treated with IV fluids.  Blood pressure was stable and patient was discharged to follow-up with PCP.  Subjective: She is seen and examined, has no complaints.  Denies shortness of breath, chest pain,  palpitations, difficulty swallowing, itchiness or hives.  Tolerated regular diet well.  Discharge Diagnoses/Hospital Course:  Angioedema of the tongue Unable to really rule out a specific cause ARB from Tribenzor versus need food exposure. Will discontinue ARB's for now, advised to avoid gummy bears.  Will discharge on prednisone for 5 days.  Will give EpiPen prescription.  AKI Prerenal Treated with IV fluids and creatinine back to baseline.  Check BMP in 1 week  Hypertension Prior to admission on Tribenzor. Metoprolol was d/ced by PCP. Will discontinue Tribenzor, patient was restarted on amlodipine and hydrochlorothiazide.  Blood pressure was above goal during hospital stay. Will discharge patient on amlodipine 10 mg daily, hydrochlorothiazide 12.5 mg daily and will resume metoprolol 100 mg twice daily. Avoid ACEi and ARBs.   Hypokalemia  Replace it with IV KCl, resolved.  Check BMP in 1 week  On the day of the discharge the patient's vitals were stable, and no other acute medical condition were reported by patient. the patient was felt safe to be discharge to home.   Discharge Instructions  You were cared for by a hospitalist during your hospital stay. If you have any questions about your discharge medications or the care you received while you were in the hospital after you are discharged, you can call the unit and asked to speak with the hospitalist on call if the hospitalist that took care of you is not available. Once you are discharged, your primary care physician will handle any further medical issues. Please note that NO REFILLS for any discharge medications will be authorized once you are discharged, as it is imperative that you return to  your primary care physician (or establish a relationship with a primary care physician if you do not have one) for your aftercare needs so that they can reassess your need for medications and monitor your lab values.  Discharge Instructions     Activity as tolerated - No restrictions   Complete by:  As directed    Call MD for:  difficulty breathing, headache or visual disturbances   Complete by:  As directed    Call MD for:  extreme fatigue   Complete by:  As directed    Call MD for:  hives   Complete by:  As directed    Call MD for:  persistant dizziness or light-headedness   Complete by:  As directed    Call MD for:  persistant nausea and vomiting   Complete by:  As directed    Call MD for:  redness, tenderness, or signs of infection (pain, swelling, redness, odor or green/yellow discharge around incision site)   Complete by:  As directed    Call MD for:  severe uncontrolled pain   Complete by:  As directed    Call MD for:  temperature >100.4   Complete by:  As directed    Diet - low sodium heart healthy   Complete by:  As directed      Allergies as of 11/11/2017      Reactions   Hydrocodone Itching   Liquid Solution      Medication List    STOP taking these medications   ibuprofen 800 MG tablet Commonly known as:  ADVIL,MOTRIN   LORazepam 1 MG tablet Commonly known as:  ATIVAN   potassium chloride 10 MEQ tablet Commonly known as:  K-DUR   TRIBENZOR 40-10-12.5 MG Tabs Generic drug:  Olmesartan-amLODIPine-HCTZ     TAKE these medications   amLODipine 10 MG tablet Commonly known as:  NORVASC Take 1 tablet (10 mg total) by mouth daily. Start taking on:  11/12/2017   BLACK CURRANT SEED OIL PO Take 5 mLs by mouth daily.   EPINEPHrine 0.3 mg/0.3 mL Soaj injection Commonly known as:  EPI-PEN Inject 0.3 mLs (0.3 mg total) into the muscle once for 1 dose. In case of severe allergic reaction (anaphylaxis)   hydrochlorothiazide 12.5 MG capsule Commonly known as:  MICROZIDE Take 1 capsule (12.5 mg total) by mouth daily. Start taking on:  11/12/2017   HYDROcodone-acetaminophen 5-325 MG tablet Commonly known as:  NORCO/VICODIN Take 1 tablet by mouth every 6 (six) hours as needed. for pain   metoprolol  tartrate 100 MG tablet Commonly known as:  LOPRESSOR Take 1 tablet (100 mg total) by mouth 2 (two) times daily.   predniSONE 20 MG tablet Commonly known as:  DELTASONE Take 1 tablet (20 mg total) by mouth daily with breakfast for 4 days.      Follow-up Information    Pearson Grippe, MD. Schedule an appointment as soon as possible for a visit in 1 week(s).   Specialty:  Internal Medicine Why:  Hospital follow up, monitor BP  Contact information: [address] [place] [address] [place] [address] [phone]          Allergies  Allergen Reactions  . Hydrocodone Itching    Liquid Solution    Consultations:     Procedures/Studies: No results found. (Echo, Carotid, EGD, Colonoscopy, ERCP)   Discharge Exam: Vitals:   11/11/17 1000 11/11/17 1136  BP: (!) 166/103 (!) 179/99  Pulse: (!) 104 (!) 112  Resp: (!) 21 10  Temp:    SpO2: 98% 98%   Vitals:   11/11/17 0800 11/11/17 0900 11/11/17 1000 11/11/17 1136  BP: (!) 156/92 (!) 146/86 (!) 166/103 (!) 179/99  Pulse: 93 93 (!) 104 (!) 112  Resp: 19 16 (!) 21 10  Temp: 98.7 F (37.1 C)     TempSrc: Oral     SpO2: 98% 96% 98% 98%  Weight:      Height:        General: Pt is alert, awake, not in acute distress HEENT: No facial swelling, normal tongue, normal swallow Cardiovascular: RRR, S1/S2 +, no rubs, no gallops Respiratory: CTA bilaterally, no wheezing, no rhonchi Abdominal: Soft, NT, ND, bowel sounds + Extremities: no edema, no cyanosis  The results of significant diagnostics from this hospitalization (including imaging, microbiology, ancillary and laboratory) are listed below for reference.     Microbiology: Recent Results (from the past 240 hour(s))  MRSA PCR Screening     Status: None   Collection Time: 11/10/17  8:07 PM  Result Value Ref Range Status   MRSA by PCR NEGATIVE NEGATIVE Final    Comment:        The GeneXpert MRSA Assay (FDA approved for NASAL specimens only), is one component of  a comprehensive MRSA colonization surveillance program. It is not intended to diagnose MRSA infection nor to guide or monitor treatment for MRSA infections. Performed at [facility], [address] [place], [place] [address]      Labs: BNP (last 3 results) No results for input(s): BNP in the last 8760 hours. Basic Metabolic Panel: Recent Labs  Lab 11/10/17 1620 11/10/17 2114 11/11/17 0411  NA 141  --  139  K 3.1*  --  3.9  CL 104  --  108  CO2 28  --  23  GLUCOSE 169*  --  136*  BUN 25*  --  15  CREATININE 1.15*  --  0.77  CALCIUM 8.9  --  8.9  MG  --  1.9  --   PHOS  --  1.9*  --    Liver Function Tests: No results for input(s): AST, ALT, ALKPHOS, BILITOT, PROT, ALBUMIN in the last 168 hours. No results for input(s): LIPASE, AMYLASE in the last 168 hours. No results for input(s): AMMONIA in the last 168 hours. CBC: Recent Labs  Lab 11/10/17 1620 11/11/17 0411  WBC 7.2 12.5*  NEUTROABS 3.5  --   HGB 13.1 12.9  HCT 40.3 39.9  MCV 80.8 81.3  PLT 359 363   Cardiac Enzymes: No results for input(s): CKTOTAL, CKMB, CKMBINDEX, TROPONINI in the last 168 hours. BNP: Invalid input(s): POCBNP CBG: No results for input(s): GLUCAP in the last 168 hours. D-Dimer No results for input(s): DDIMER in the last 72 hours. Hgb A1c No results for input(s): HGBA1C in the last 72 hours. Lipid Profile No results for input(s): CHOL, HDL, LDLCALC, TRIG, CHOLHDL, LDLDIRECT in the last 72 hours. Thyroid function studies No results for input(s): TSH, T4TOTAL, T3FREE, THYROIDAB in the last 72 hours.  Invalid input(s): FREET3 Anemia work up No results for input(s): VITAMINB12, FOLATE, FERRITIN, TIBC, IRON, RETICCTPCT in the last 72 hours. Urinalysis    Component Value Date/Time   COLORURINE YELLOW 08/16/2014 2044   APPEARANCEUR CLEAR 08/16/2014 2044   LABSPEC 1.005 08/16/2014 2044   PHURINE 6.0 08/16/2014 2044   GLUCOSEU NEGATIVE 08/16/2014 2044   HGBUR  TRACE (A) 08/16/2014 2044   BILIRUBINUR NEGATIVE 08/16/2014 2044   KETONESUR NEGATIVE 08/16/2014  2044   PROTEINUR NEGATIVE 08/16/2014 2044   UROBILINOGEN 0.2 08/16/2014 2044   NITRITE NEGATIVE 08/16/2014 2044   LEUKOCYTESUR NEGATIVE 08/16/2014 2044   Sepsis Labs Invalid input(s): PROCALCITONIN,  WBC,  LACTICIDVEN Microbiology Recent Results (from the past 240 hour(s))  MRSA PCR Screening     Status: None   Collection Time: 11/10/17  8:07 PM  Result Value Ref Range Status   MRSA by PCR NEGATIVE NEGATIVE Final    Comment:        The GeneXpert MRSA Assay (FDA approved for NASAL specimens only), is one component of a comprehensive MRSA colonization surveillance program. It is not intended to diagnose MRSA infection nor to guide or monitor treatment for MRSA infections. Performed at [facility], [address] [place], [place] [address]     Time coordinating discharge: 32 minutes  SIGNED:  Latrelle Dodrill, MD  [facility] 11/11/2017, 11:42 AM  Pager please text page via  [email or web address]  Note - This record has been created using [employer]. Chart creation errors have been sought, but may not always have been located. Such creation errors do not reflect on the standard of medical care.

## 2017-11-11 NOTE — Care Management Note (Signed)
Case Management Note  Patient Details  Name: Amy Wheeler MRN: [id number] Date of Birth: April 12, 1970  Subjective/Objective:                  Allergic reaction  Action/Plan: Will follow for progression of care and clinical status. Will follow for case management needs none present at this time.  Expected Discharge Date:                  Expected Discharge Plan:  Home/Self Care  In-House Referral:     Discharge planning Services  CM Consult  Post Acute Care Choice:    Choice offered to:     DME Arranged:    DME Agency:     HH Arranged:    HH Agency:     Status of Service:  In process, will continue to follow  If discussed at Long Length of Stay Meetings, dates discussed:    Additional Comments:  Golda Acre, RN 11/11/2017, 9:09 AM

## 2018-07-23 IMAGING — CR DG CHEST 2V
2 series · 2 of 2 positions shown · non-contrast
Comparison: 05/26/2014

CLINICAL DATA: Tachycardia.

EXAM:
CHEST  2 VIEW

[w chest pa]
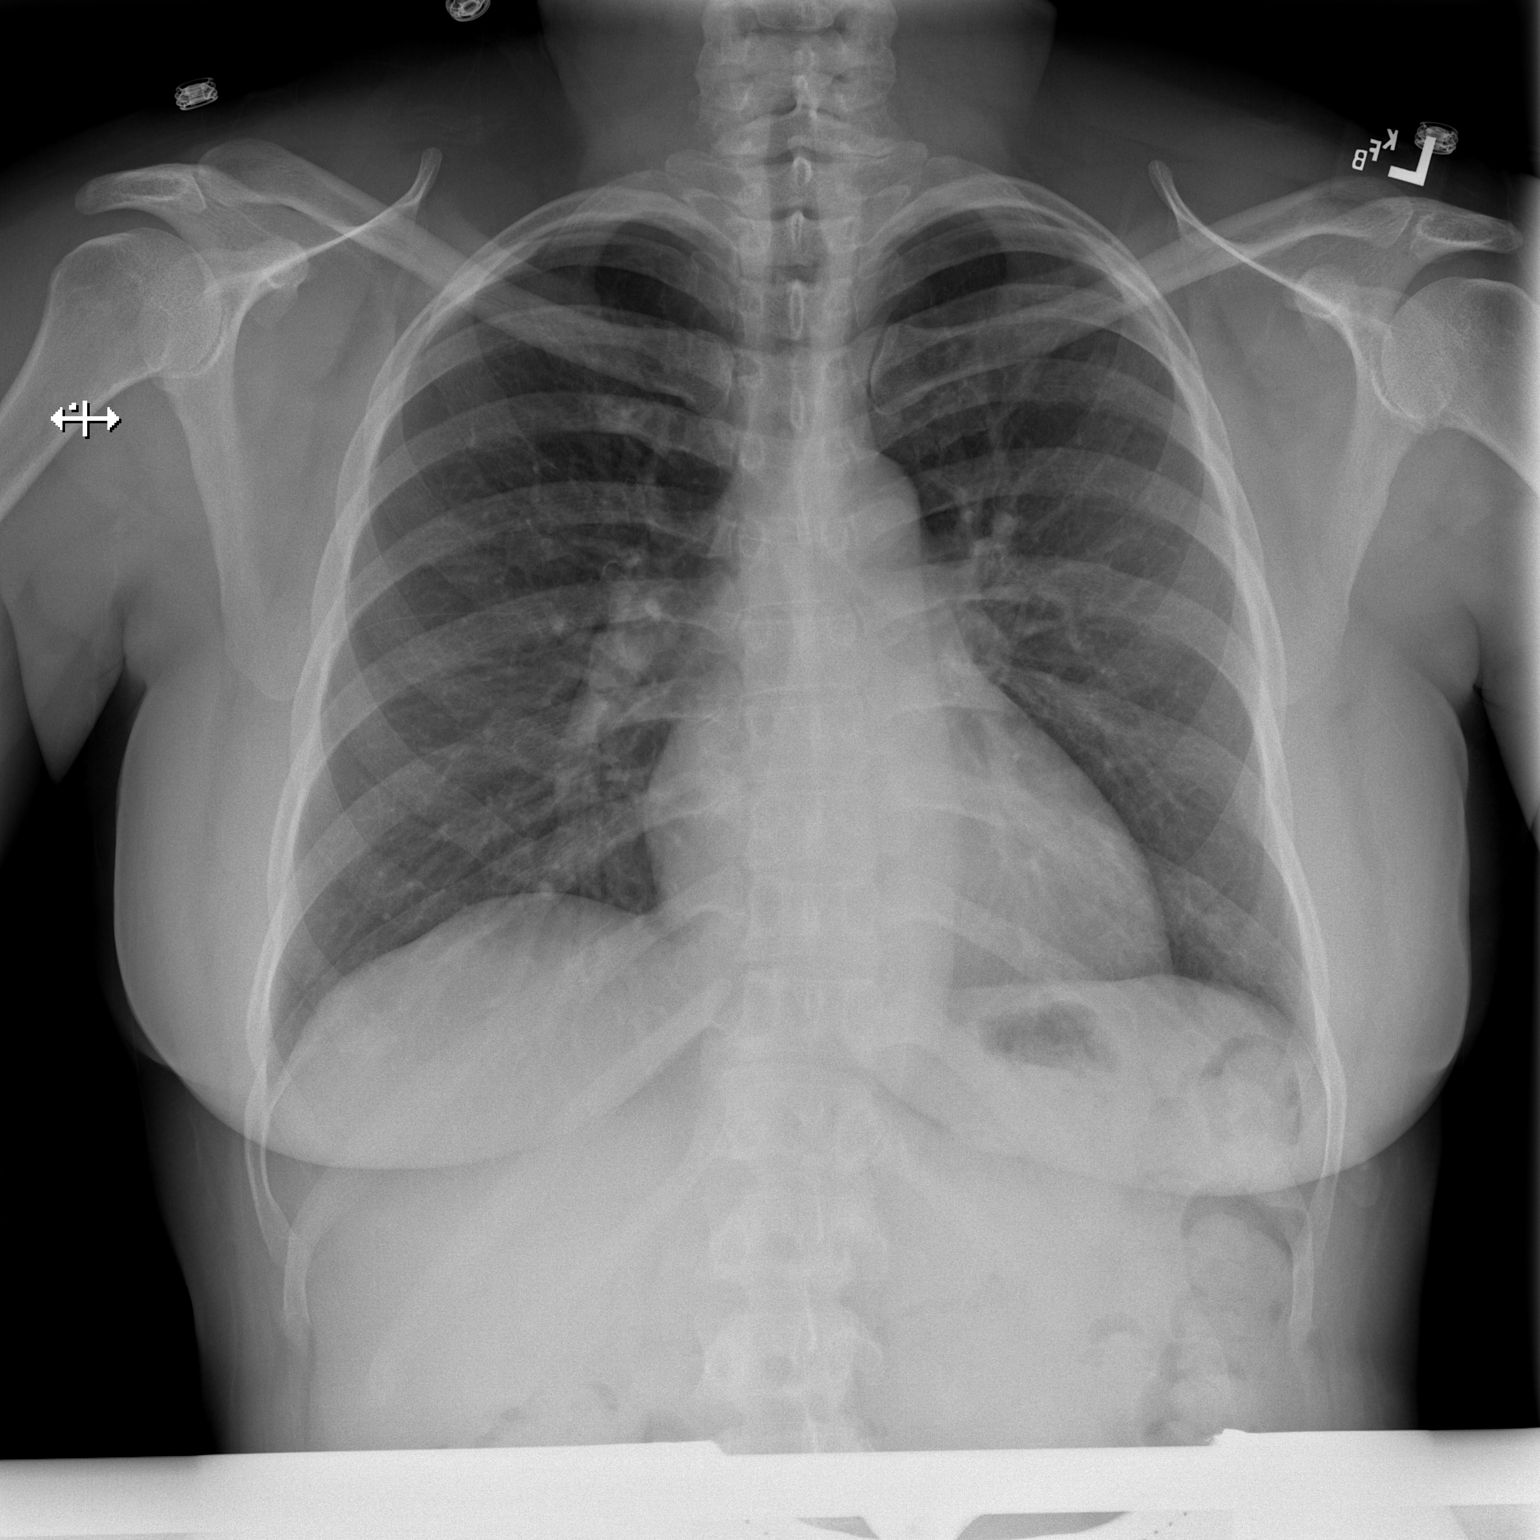

[w chest lat]
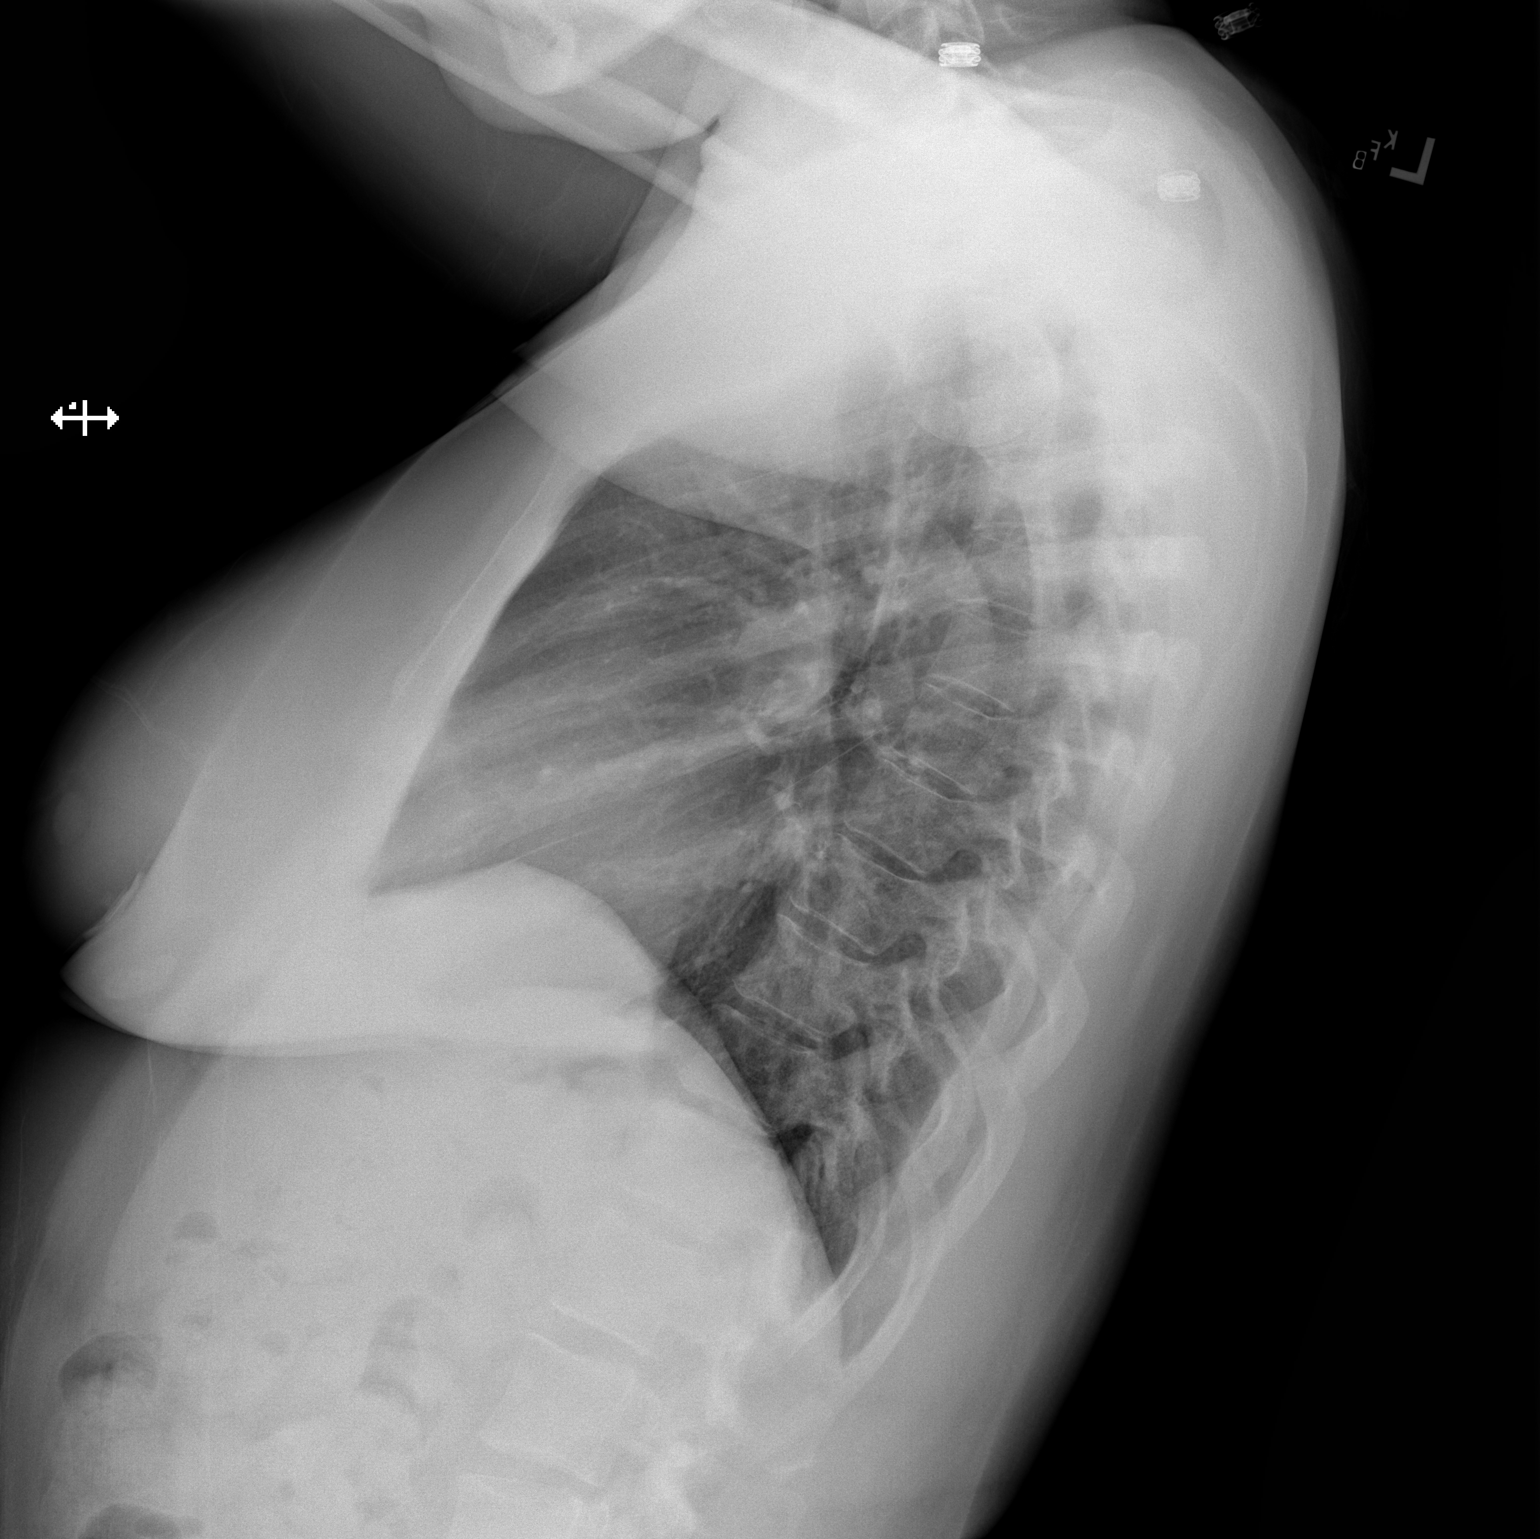

[2 of 2 positions shown; findings below may reference images not displayed]

FINDINGS: Normal heart size and mediastinal contours. No acute infiltrate or
edema. No effusion or pneumothorax. No acute osseous findings.
IMPRESSION: Negative chest.

## 2019-09-21 ENCOUNTER — Other Ambulatory Visit: Payer: Self-pay

## 2019-09-21 ENCOUNTER — Ambulatory Visit (INDEPENDENT_AMBULATORY_CARE_PROVIDER_SITE_OTHER): Payer: PRIVATE HEALTH INSURANCE | Admitting: Physician Assistant

## 2019-09-21 ENCOUNTER — Telehealth: Payer: Self-pay | Admitting: *Deleted

## 2019-09-21 ENCOUNTER — Encounter: Payer: Self-pay | Admitting: Physician Assistant

## 2019-09-21 DIAGNOSIS — L235 Allergic contact dermatitis due to other chemical products: Secondary | ICD-10-CM | POA: Diagnosis not present

## 2019-09-21 NOTE — Progress Notes (Signed)
   Follow up Visit  Subjective  Amy Wheeler is a 49 y.o. female who presents for the following: Skin Problem (Breakout since december. Mostly on forehead.  It does itch. Tx none patient washes face with just her face wash. ). Has been itching and breaking out since December.   Objective  Well appearing patient in no apparent distress; mood and affect are within normal limits.  Face examined. Relevant physical exam findings are noted in the Assessment and Plan.    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 She uses cerave for cleanser, black soap with peppermint for scalp. She can use hydrocortisone bid for up to 2 weeks and change/avoid her current products. Vanicream bar and lotion were given and told to wash face after hair. If she does not improve we discussed the possible need for  Patch testing.

## 2019-09-21 NOTE — Telephone Encounter (Signed)
Phone call to [facility] to call in prescription for the Tacrolimus 0.3, clobetasol 0.05, and minoxidil 5 % # 30 ml. Patient notified gave patient 2 refills.

## 2019-09-23 MED ORDER — HYDROCORTISONE 2.5 % EX CREA: TOPICAL_CREAM | Freq: Two times a day (BID) | CUTANEOUS | 1 refills | Status: DC | PRN

## 2020-08-17 ENCOUNTER — Other Ambulatory Visit: Payer: Self-pay

## 2020-08-17 ENCOUNTER — Encounter: Payer: Self-pay | Admitting: Cardiology

## 2020-08-17 ENCOUNTER — Ambulatory Visit: Payer: PRIVATE HEALTH INSURANCE | Admitting: Cardiology

## 2020-08-17 VITALS — BP 175/108 | HR 86 | Temp 97.9°F | Resp 17 | Ht 63.0 in | Wt 174.8 lb

## 2020-08-17 DIAGNOSIS — R9431 Abnormal electrocardiogram [ECG] [EKG]: Secondary | ICD-10-CM

## 2020-08-17 DIAGNOSIS — I10 Essential (primary) hypertension: Secondary | ICD-10-CM

## 2020-08-17 DIAGNOSIS — R002 Palpitations: Secondary | ICD-10-CM

## 2020-08-17 MED ORDER — LABETALOL HCL 100 MG PO TABS: 100.0000 mg | ORAL_TABLET | Freq: Two times a day (BID) | ORAL | 2 refills | Status: DC

## 2020-08-17 NOTE — Progress Notes (Signed)
Primary Physician/Referring:  Dorisann Frames, MD  Patient ID: Amy Wheeler, female    DOB: Nov 01, 1970, 51 y.o.   MRN: [id number]  Chief Complaint  Patient presents with   FLUTTERING   HPI:    Amy Wheeler  is a 50 y.o. African-American female patient with hypertension, GERD, hypothyroidism, last seen by me in 2014 for musculoskeletal chest pain presents to reestablish care for recent onset palpitations over past several weeks.  No specific complaints today.  Past Medical History:  Diagnosis Date   Graves disease    Hypertension    Past Surgical History:  Procedure Laterality Date   BREAST REDUCTION SURGERY     DILATION AND CURETTAGE OF UTERUS     Family History  Problem Relation Age of Onset   Hypertension Mother     Social History   Tobacco Use   Smoking status: Never   Smokeless tobacco: Never  Substance Use Topics   Alcohol use: Yes    Comment: rarely   Marital Status: Single  ROS  Review of Systems  Cardiovascular:  Positive for palpitations. Negative for chest pain, dyspnea on exertion and leg swelling.  Gastrointestinal:  Negative for melena.  Objective  Blood pressure (!) 175/108, pulse 86, temperature 97.9 F (36.6 C), temperature source Temporal, resp. rate 17, height 5\' 3"  (1.6 m), weight 174 lb 12.8 oz (79.3 kg), SpO2 98 %. Body mass index is 30.96 kg/m.  Vitals with BMI 08/17/2020 08/17/2020 11/11/2017  Height - 5\' 3"  -  Weight - 174 lbs 13 oz -  BMI - 30.97 -  Systolic 175 178 11/13/2017  Diastolic 108 113  Pulse 86 90 96    Physical Exam Neck:     Vascular: No carotid bruit or JVD.  Cardiovascular:     Rate and Rhythm: Normal rate and regular rhythm.     Pulses: Intact distal pulses.     Heart sounds: Normal heart sounds. No murmur heard.   No gallop.  Pulmonary:     Effort: Pulmonary effort is normal.     Breath sounds: Normal breath sounds.  Abdominal:     General: Bowel sounds are normal.     Palpations: Abdomen is soft.   Musculoskeletal:        General: No swelling.     Laboratory examination:   External labs:   Cholesterol, total 155.000 m 11/11/2018 HDL 59.000 mg 08/31/2019 LDL 75.000 cal 08/31/2019 Triglycerides 43.000 mg 08/31/2019  TSH 3.160 11/19/2019  Medications and allergies   Allergies  Allergen Reactions   Hydrocodone Itching    Liquid Solution    Medication prior to this encounter:   Outpatient Medications Prior to Visit  Medication Sig Dispense Refill   BLACK CURRANT SEED OIL PO Take by mouth.     HYDROcodone-acetaminophen (NORCO/VICODIN) 5-325 MG tablet Take 1 tablet by mouth every 6 (six) hours as needed. for pain  0   Olmesartan-amLODIPine-HCTZ (TRIBENZOR) 40-10-12.5 MG TABS Take 1 tablet by mouth daily.     amLODipine (NORVASC) 10 MG tablet Take 1 tablet (10 mg total) by mouth daily. 30 tablet 0   BLACK CURRANT SEED OIL PO Take 5 mLs by mouth daily.      hydrochlorothiazide (MICROZIDE) 12.5 MG capsule Take 1 capsule (12.5 mg total) by mouth daily. 30 capsule 0   hydrocortisone 2.5 % cream Apply topically 2 (two) times daily as needed (Rash). 30 g 1   methimazole (TAPAZOLE) 10 MG tablet Take 10 mg by mouth 2 (two) times daily.  metoprolol tartrate (LOPRESSOR) 100 MG tablet Take 1 tablet (100 mg total) by mouth 2 (two) times daily. 60 tablet 0   phentermine (ADIPEX-P) 37.5 MG tablet Take 1 tablet by mouth daily. (Patient not taking: Reported on 08/17/2020)     No facility-administered medications prior to visit.    Medication list after today's encounter   Medications prior to this visit Current Meds  Medication Sig   BLACK CURRANT SEED OIL PO Take by mouth.   HYDROcodone-acetaminophen (NORCO/VICODIN) 5-325 MG tablet Take 1 tablet by mouth every 6 (six) hours as needed. for pain   labetalol (NORMODYNE) 100 MG tablet Take 1 tablet (100 mg total) by mouth 2 (two) times daily.   Olmesartan-amLODIPine-HCTZ (TRIBENZOR) 40-10-12.5 MG TABS Take 1 tablet by mouth daily.     Current Outpatient Medications on File Prior to Visit  Medication Sig Dispense Refill   BLACK CURRANT SEED OIL PO Take by mouth.     HYDROcodone-acetaminophen (NORCO/VICODIN) 5-325 MG tablet Take 1 tablet by mouth every 6 (six) hours as needed. for pain  0   Olmesartan-amLODIPine-HCTZ (TRIBENZOR) 40-10-12.5 MG TABS Take 1 tablet by mouth daily.     No current facility-administered medications on file prior to visit.    Radiology:   No results found.  Cardiac Studies:   NA  EKG:   EKG 08/17/2020: Normal sinus rhythm at rate of 76 bpm, left atrial enlargement, normal axis.  LVH with repolarization abnormality, cannot exclude lateral ischemia. No significant change from 02/15/2017.  Assessment     ICD-10-CM   1. Palpitations  R00.2 labetalol (NORMODYNE) 100 MG tablet    2. Accelerated hypertension  I10 EKG 12-Lead    labetalol (NORMODYNE) 100 MG tablet    PCV ECHOCARDIOGRAM COMPLETE    3. Nonspecific abnormal electrocardiogram (ECG) (EKG)  R94.31 PCV ECHOCARDIOGRAM COMPLETE      Medications Discontinued During This Encounter  Medication Reason   amLODipine (NORVASC) 10 MG tablet Error   BLACK CURRANT SEED OIL PO Error   hydrochlorothiazide (MICROZIDE) 12.5 MG capsule Error   hydrocortisone 2.5 % cream Error   metoprolol tartrate (LOPRESSOR) 100 MG tablet Error   methimazole (TAPAZOLE) 10 MG tablet Error   phentermine (ADIPEX-P) 37.5 MG tablet Discontinued by provider    Meds ordered this encounter  Medications   labetalol (NORMODYNE) 100 MG tablet    Sig: Take 1 tablet (100 mg total) by mouth 2 (two) times daily.    Dispense:  60 tablet    Refill:  2    Orders Placed This Encounter  Procedures   EKG 12-Lead   PCV ECHOCARDIOGRAM COMPLETE    Standing Status:   Future    Standing Expiration Date:   08/17/2021   Recommendations:   Amy Wheeler is a 50 y.o. African-American female patient with hypertension, GERD, hypothyroidism, last seen by me in 2014 for  musculoskeletal chest pain presents to reestablish care for recent onset palpitations over past several weeks.   Patient has been under treatment for stress recently due to her job situation and also she just went to [place] to be clear husband in the airport and did not take her blood pressure medications.  Blood pressure is markedly elevated, I have added labetalol 1 mg p.o. twice daily.  She has not been taking phentermine regularly but did take 1 tablet 3 days ago, I have discontinued phentermine due to abnormal EKG and hypertension and palpitations.  Advised her that the goal blood pressure would be 130/80 mmHg and after 1  week of taking labetalol 100 mg twice daily, she could certainly uptitrated 250 mg or even 200 mg twice daily dosing.  She will continue Tribenzor.  I will obtain previously performed labs for my evaluation.  Obtain echocardiogram in view of hypertension and abnormal EKG.  Office visit in 6 weeks.  Palpitation symptoms to suggest PACs and PVCs but could be related to accelerated hypertension.  I have taken her off of work for today and tomorrow.    Yates Decamp, MD, [facility] 08/17/2020, 12:41 PM Office: [phone]

## 2020-08-19 ENCOUNTER — Encounter: Payer: Self-pay | Admitting: Cardiology

## 2020-08-24 ENCOUNTER — Other Ambulatory Visit: Payer: No Typology Code available for payment source

## 2020-08-25 ENCOUNTER — Other Ambulatory Visit: Payer: Self-pay

## 2020-08-25 ENCOUNTER — Ambulatory Visit: Payer: PRIVATE HEALTH INSURANCE

## 2020-08-25 DIAGNOSIS — I10 Essential (primary) hypertension: Secondary | ICD-10-CM

## 2020-08-25 DIAGNOSIS — R9431 Abnormal electrocardiogram [ECG] [EKG]: Secondary | ICD-10-CM

## 2020-08-29 NOTE — Telephone Encounter (Signed)
From pt

## 2020-09-02 ENCOUNTER — Telehealth: Payer: Self-pay | Admitting: Cardiology

## 2020-09-02 ENCOUNTER — Telehealth: Payer: Self-pay

## 2020-09-02 DIAGNOSIS — I10 Essential (primary) hypertension: Secondary | ICD-10-CM

## 2020-09-02 MED ORDER — NEBIVOLOL HCL 20 MG PO TABS: 1.0000 | ORAL_TABLET | Freq: Every day | ORAL | 2 refills | Status: DC

## 2020-09-02 NOTE — Telephone Encounter (Signed)
Pt called and stated that she sent you a message on my chart but she is not sure that you got the entire message. She stated that the Labetalol is making her fingers burn and tingle. She also stated she thinks it is causing her nails to lift up from her nail bed. It is also making her tired and she stated she will not take it anymore because it is affecting her work. She would like to know if there is anything else she can take. Please advise.

## 2020-09-02 NOTE — Telephone Encounter (Signed)
Spoke to pt

## 2020-09-02 NOTE — Telephone Encounter (Signed)
Pt is requesting a call regarding her medication she is taking for her BP.

## 2020-09-02 NOTE — Telephone Encounter (Signed)
Chief Complaint  Patient presents with   Medication Reaction    Nails lifting from nail bed with Labetalol     ICD-10-CM   1. Accelerated hypertension  I10 Nebivolol HCl (BYSTOLIC) 20 MG TABS     Meds ordered this encounter  Medications   Nebivolol HCl (BYSTOLIC) 20 MG TABS    Sig: Take 1 tablet (20 mg total) by mouth daily.    Dispense:  30 tablet    Refill:  2    Discontinue Labetalol side effects     Yates Decamp, MD, [facility] 09/02/2020, 4:51 PM Office: [phone] Fax: [phone] Pager: [phone]

## 2020-09-29 ENCOUNTER — Other Ambulatory Visit: Payer: Self-pay

## 2020-09-29 ENCOUNTER — Encounter: Payer: Self-pay | Admitting: Cardiology

## 2020-09-29 ENCOUNTER — Ambulatory Visit: Payer: PRIVATE HEALTH INSURANCE | Admitting: Cardiology

## 2020-09-29 VITALS — BP 159/104 | HR 79 | Temp 98.0°F | Resp 17 | Ht 63.0 in | Wt 177.2 lb

## 2020-09-29 DIAGNOSIS — R9431 Abnormal electrocardiogram [ECG] [EKG]: Secondary | ICD-10-CM

## 2020-09-29 DIAGNOSIS — I10 Essential (primary) hypertension: Secondary | ICD-10-CM

## 2020-09-29 DIAGNOSIS — R002 Palpitations: Secondary | ICD-10-CM

## 2020-09-29 MED ORDER — SPIRONOLACTONE 25 MG PO TABS: 25.0000 mg | ORAL_TABLET | Freq: Every morning | ORAL | 2 refills | Status: DC

## 2020-09-29 MED ORDER — NEBIVOLOL HCL 20 MG PO TABS: 1.0000 | ORAL_TABLET | Freq: Every evening | ORAL | 3 refills | Status: DC

## 2020-09-29 NOTE — Progress Notes (Signed)
Primary Physician/Referring:  Dorisann Frames, MD  Patient ID: Amy Wheeler, female    DOB: 17-Nov-1970, 50 y.o.   MRN: [id number]  Chief Complaint  Patient presents with  . Hypertension  . Palpitations  . Follow-up    6 WEEKS    HPI:    Amy Wheeler  is a 50 y.o. African-American female patient with hypertension, GERD, hypothyroidism, presents for follow-up of hypertension and palpitations that started about 2 months ago.  I had started her on labetalol however she did not tolerate stating that she was extremely fatigued and hence was switched to Bystolic 20 mg daily.  She has not had any further palpitations since then however states that blood pressure is still not controlled.  She is asymptomatic otherwise and continues to exercise regularly.    Past Medical History:  Diagnosis Date  . Graves disease   . Hypertension    Past Surgical History:  Procedure Laterality Date  . BREAST REDUCTION SURGERY    . DILATION AND CURETTAGE OF UTERUS     Family History  Problem Relation Age of Onset  . Hypertension Mother     Social History   Tobacco Use  . Smoking status: Never  . Smokeless tobacco: Never  Substance Use Topics  . Alcohol use: Yes    Comment: rarely   Marital Status: Single  ROS  Review of Systems  Cardiovascular:  Negative for chest pain, dyspnea on exertion, leg swelling and palpitations.  Gastrointestinal:  Negative for melena.  Objective  Blood pressure (!) 159/104, pulse 79, temperature 98 F (36.7 C), temperature source Temporal, resp. rate 17, height 5\' 3"  (1.6 m), weight 177 lb 3.2 oz (80.4 kg), SpO2 97 %. Body mass index is 31.39 kg/m.  Vitals with BMI 09/29/2020 09/29/2020 08/17/2020  Height - 5\' 3"  -  Weight - 177 lbs 3 oz -  BMI - 31.4 -  Systolic 159 157 08/19/2020  Diastolic 104 95 108  Pulse 79 73 86    Physical Exam Neck:     Vascular: No carotid bruit or JVD.  Cardiovascular:     Rate and Rhythm: Normal rate and regular rhythm.     Pulses:  Intact distal pulses.     Heart sounds: Normal heart sounds. No murmur heard.   No gallop.  Pulmonary:     Effort: Pulmonary effort is normal.     Breath sounds: Normal breath sounds.  Abdominal:     General: Bowel sounds are normal.     Palpations: Abdomen is soft.  Musculoskeletal:        General: No swelling.     Laboratory examination:   External labs:   Cholesterol, total 155.000 m 11/11/2018 HDL 59.000 mg 08/31/2019 LDL 75.000 cal 08/31/2019 Triglycerides 43.000 mg 08/31/2019  TSH 3.160 11/19/2019  Medications and allergies   Allergies  Allergen Reactions  . Hydrocodone Itching    Liquid Solution    Medication prior to this encounter:   Outpatient Medications Prior to Visit  Medication Sig Dispense Refill  . BLACK CURRANT SEED OIL PO Take by mouth.    . Olmesartan-amLODIPine-HCTZ 40-10-12.5 MG TABS Take 1 tablet by mouth daily.    11/21/2019 HYDROcodone-acetaminophen (NORCO/VICODIN) 5-325 MG tablet Take 1 tablet by mouth every 6 (six) hours as needed. for pain  0  . Nebivolol HCl (BYSTOLIC) 20 MG TABS Take 1 tablet (20 mg total) by mouth daily. (Patient not taking: Reported on 09/29/2020) 30 tablet 2   No facility-administered medications prior to visit.  Medication list after today's encounter   Medications prior to this visit Current Meds  Medication Sig  . BLACK CURRANT SEED OIL PO Take by mouth.  . Olmesartan-amLODIPine-HCTZ 40-10-12.5 MG TABS Take 1 tablet by mouth daily.  Marland Kitchen spironolactone (ALDACTONE) 25 MG tablet Take 1 tablet (25 mg total) by mouth every morning.    Current Outpatient Medications on File Prior to Visit  Medication Sig Dispense Refill  . BLACK CURRANT SEED OIL PO Take by mouth.    . Olmesartan-amLODIPine-HCTZ 40-10-12.5 MG TABS Take 1 tablet by mouth daily.     No current facility-administered medications on file prior to visit.    Radiology:   No results found.  Cardiac Studies:   PCV ECHOCARDIOGRAM COMPLETE  08/25/2020  Narrative Echocardiogram 08/25/2020: Normal LV systolic function with visual EF 60-65%. Left ventricle cavity is normal in size. Moderate left ventricular hypertrophy. Normal global wall motion. Normal diastolic filling pattern, normal LAP. Mild (Grade I) mitral regurgitation. Mild tricuspid regurgitation. No evidence of pulmonary hypertension. No prior study for comparison.     EKG:   EKG 08/17/2020: Normal sinus rhythm at rate of 76 bpm, left atrial enlargement, normal axis.  LVH with repolarization abnormality, cannot exclude lateral ischemia. No significant change from 02/15/2017.  Assessment     ICD-10-CM   1. Accelerated hypertension  I10 Basic metabolic panel    Nebivolol HCl (BYSTOLIC) 20 MG TABS    Basic metabolic panel    2. Palpitations  R00.2     3. Nonspecific abnormal electrocardiogram (ECG) (EKG)  R94.31       Medications Discontinued During This Encounter  Medication Reason  . HYDROcodone-acetaminophen (NORCO/VICODIN) 5-325 MG tablet Error  . Nebivolol HCl (BYSTOLIC) 20 MG TABS    Meds ordered this encounter  Medications  . spironolactone (ALDACTONE) 25 MG tablet    Sig: Take 1 tablet (25 mg total) by mouth every morning.    Dispense:  30 tablet    Refill:  2  . Nebivolol HCl (BYSTOLIC) 20 MG TABS    Sig: Take 1 tablet (20 mg total) by mouth every evening.    Dispense:  90 tablet    Refill:  3    Orders Placed This Encounter  Procedures  . Basic metabolic panel    Standing Status:   Future    Number of Occurrences:   1    Standing Expiration Date:   09/29/2021    Recommendations:   Amy Wheeler is a 50 y.o. African-American female patient with hypertension, GERD, hypothyroidism, presents for follow-up of hypertension and palpitations that started about 2 months ago.  I had started her on labetalol however she did not tolerate stating that she was extremely fatigued and hence was switched to Bystolic 20 mg daily.  She has not had any  further palpitations since then however states that blood pressure is still not controlled.  She is asymptomatic otherwise and continues to exercise regularly.    I reviewed her echocardiogram, I suspect the abnormal EKG is related to hypertensive heart disease.  I would like to try spironolactone 25 mg daily and recheck BMP in 2 to 3 weeks.  We discussed dietary modification as well.  Would like to see her back in 2 months for follow-up, in spite of abnormal EKG, would not recommend stress testing as she is asymptomatic.  She also continues to be fairly active without symptoms.  I have discussed with her the importance of regulating blood pressure and the risk of especially stroke  and renal failure, she acknowledged as her father is on hemodialysis.    Amy Decamp, MD, [facility] 09/29/2020, 5:21 PM Office: [phone]

## 2020-10-06 ENCOUNTER — Emergency Department (HOSPITAL_COMMUNITY)
Admission: EM | Admit: 2020-10-06 | Discharge: 2020-10-06 | Disposition: A | Discharge status: Home / Self Care | Payer: No Typology Code available for payment source | Attending: Emergency Medicine | Admitting: Emergency Medicine

## 2020-10-06 ENCOUNTER — Other Ambulatory Visit: Payer: Self-pay

## 2020-10-06 ENCOUNTER — Encounter (HOSPITAL_COMMUNITY): Payer: Self-pay

## 2020-10-06 DIAGNOSIS — Z733 Stress, not elsewhere classified: Secondary | ICD-10-CM | POA: Insufficient documentation

## 2020-10-06 DIAGNOSIS — Z79899 Other long term (current) drug therapy: Secondary | ICD-10-CM | POA: Diagnosis not present

## 2020-10-06 DIAGNOSIS — F439 Reaction to severe stress, unspecified: Secondary | ICD-10-CM

## 2020-10-06 DIAGNOSIS — I10 Essential (primary) hypertension: Secondary | ICD-10-CM | POA: Diagnosis not present

## 2020-10-06 NOTE — ED Provider Notes (Signed)
[facility]-EMERGENCY DEPT Provider Note   CSN: [id number] Arrival date & time: 10/06/20  0324     History Chief Complaint  Patient presents with   Blood Pressure Check    Amy Wheeler is a 50 y.o. female.  The history is provided by the patient and medical records.  Amy Wheeler is a 50 y.o. female who presents to the Emergency Department complaining of blood pressure check. She checked into the emergency department to see if her blood pressure was elevated. She does report being under increased stress. Currently her son is in the parking lot of the hospital and she is attempting to IVC him due to erratic behavior and drug use. She also had a friend die this week. She has a history of hypertension and she took one of her three evening medications for blood pressure. She denies any headache, chest pain, difficulty breathing, numbness, weakness, nausea, vomiting.    Past Medical History:  Diagnosis Date   Graves disease    Hypertension     Patient Active Problem List   Diagnosis Date Noted   Angioedema 11/10/2017   Essential hypertension 05/25/2014    Past Surgical History:  Procedure Laterality Date   BREAST REDUCTION SURGERY     DILATION AND CURETTAGE OF UTERUS       OB History   No obstetric history on file.     Family History  Problem Relation Age of Onset   Hypertension Mother     Social History   Tobacco Use   Smoking status: Never   Smokeless tobacco: Never  Vaping Use   Vaping Use: Never used  Substance Use Topics   Alcohol use: Yes    Comment: rarely   Drug use: No    Home Medications Prior to Admission medications   Medication Sig Start Date End Date Taking? Authorizing Provider  BLACK CURRANT SEED OIL PO Take by mouth.    [provider]  Nebivolol HCl (BYSTOLIC) 20 MG TABS Take 1 tablet (20 mg total) by mouth every evening. 09/29/20   Yates Decamp, MD  Olmesartan-amLODIPine-HCTZ 40-10-12.5 MG TABS Take 1 tablet by  mouth daily.    [provider]  spironolactone (ALDACTONE) 25 MG tablet Take 1 tablet (25 mg total) by mouth every morning. 09/29/20 12/28/20  Yates Decamp, MD    Allergies    Hydrocodone  Review of Systems   Review of Systems  All other systems reviewed and are negative.  Physical Exam Updated Vital Signs BP (!) 192/102 (BP Location: Left Arm)   Pulse 62   Resp 16   Ht 5\' 3"  (1.6 m)   Wt 79.4 kg   SpO2 100%   BMI 31.00 kg/m   Physical Exam Vitals and nursing note reviewed.  Constitutional:      Appearance: She is well-developed.  HENT:     Head: Normocephalic and atraumatic.  Cardiovascular:     Rate and Rhythm: Normal rate and regular rhythm.  Pulmonary:     Effort: Pulmonary effort is normal. No respiratory distress.  Musculoskeletal:        General: No swelling or tenderness.  Skin:    General: Skin is warm and dry.  Neurological:     Mental Status: She is alert and oriented to person, place, and time.  Psychiatric:        Behavior: Behavior normal.    ED Results / Procedures / Treatments   Labs (all labs ordered are listed, but only abnormal results are  displayed) Labs Reviewed - No data to display  EKG None  Radiology No results found.  Procedures Procedures   Medications Ordered in ED Medications - No data to display  ED Course  I have reviewed the triage vital signs and the nursing notes.  Pertinent labs & imaging results that were available during my care of the patient were reviewed by me and considered in my medical decision making (see chart for details).    MDM Rules/Calculators/A&P                          patient with history of hypertension here for evaluation of blood pressure check. She is under increased stress due to social issues but is otherwise asymptomatic. Patient hypertensive to 192 systolic on ED presentation, on recheck it was 170. She has no evidence of end organ damage based on review of systems. Blood pressure  improving with rest in the emergency department. Discussed with patient continuing her medications as directed with outpatient PCP as well as therapist follow-up and return precautions.  Final Clinical Impression(s) / ED Diagnoses Final diagnoses:  Asymptomatic hypertension  Stress at home    Rx / DC Orders ED Discharge Orders     None        Tilden Fossa, MD 10/06/20 [phone]

## 2020-10-06 NOTE — ED Triage Notes (Signed)
Pt reports some stressors in her life this past week. Pt wants a blood pressure check. Pt reports taking medication for hypertension.

## 2020-10-06 NOTE — Discharge Instructions (Addendum)
Call your therapist later today to arrange follow-up. Get rechecked immediately if you develop new or concerning symptoms or your grief becomes overwhelming.

## 2020-10-10 ENCOUNTER — Ambulatory Visit: Payer: PRIVATE HEALTH INSURANCE | Admitting: Dermatology

## 2020-11-07 ENCOUNTER — Telehealth: Payer: Self-pay | Admitting: Cardiology

## 2020-11-07 ENCOUNTER — Other Ambulatory Visit: Payer: Self-pay

## 2020-11-07 MED ORDER — OLMESARTAN-AMLODIPINE-HCTZ 40-10-12.5 MG PO TABS: 1.0000 | ORAL_TABLET | Freq: Every day | ORAL | 1 refills | Status: DC

## 2020-11-07 NOTE — Telephone Encounter (Signed)
Ref req for   Olmesartan-amLODIPine-HCTZ 40-10-12.5 MG TABS  Change in pharmacy...needs it send to [facility] on [address] in [place]

## 2020-11-07 NOTE — Telephone Encounter (Signed)
Done

## 2020-11-30 ENCOUNTER — Other Ambulatory Visit: Payer: Self-pay

## 2020-11-30 ENCOUNTER — Ambulatory Visit: Payer: PRIVATE HEALTH INSURANCE | Admitting: Cardiology

## 2020-11-30 ENCOUNTER — Encounter: Payer: Self-pay | Admitting: Cardiology

## 2020-11-30 VITALS — BP 148/92 | HR 91 | Temp 98.0°F | Resp 16 | Ht 63.0 in | Wt 180.8 lb

## 2020-11-30 DIAGNOSIS — I1A0 Resistant hypertension: Secondary | ICD-10-CM

## 2020-11-30 DIAGNOSIS — R002 Palpitations: Secondary | ICD-10-CM | POA: Insufficient documentation

## 2020-11-30 DIAGNOSIS — I10 Essential (primary) hypertension: Secondary | ICD-10-CM

## 2020-11-30 MED ORDER — CLONIDINE 0.1 MG/24HR TD PTWK: 0.1000 mg | MEDICATED_PATCH | TRANSDERMAL | 3 refills | Status: DC

## 2020-11-30 MED ORDER — SPIRONOLACTONE 25 MG PO TABS: 25.0000 mg | ORAL_TABLET | Freq: Every morning | ORAL | 2 refills | Status: DC

## 2020-11-30 NOTE — Progress Notes (Signed)
Primary Physician/Referring:  Dorisann Frames, MD  Patient ID: Amy Wheeler, female    DOB: 1970-11-15, 50 y.o.   MRN: [id number]  Chief Complaint  Patient presents with   Hypertension   Follow-up    2 months    HPI:    Amy Wheeler  is a 50 y.o. African-American female patient with hypertension, GERD, hypothyroidism, presents for follow-up of hypertension and palpitations.  She is having multiple medication intolerances, she did not tolerate Bystolic as she had brittle nails and spironolactone caused her to have muscle cramps but she is willing to try this again. She is asymptomatic otherwise and continues to exercise regularly.    Past Medical History:  Diagnosis Date   Graves disease    Hypertension    Past Surgical History:  Procedure Laterality Date   BREAST REDUCTION SURGERY     DILATION AND CURETTAGE OF UTERUS     Family History  Problem Relation Age of Onset   Hypertension Mother     Social History   Tobacco Use   Smoking status: Never   Smokeless tobacco: Never  Substance Use Topics   Alcohol use: Yes    Comment: rarely   Marital Status: Single  ROS  Review of Systems  Cardiovascular:  Negative for chest pain, dyspnea on exertion, leg swelling and palpitations.  Gastrointestinal:  Negative for melena.  Objective  Blood pressure (!) 148/92, pulse 91, temperature 98 F (36.7 C), temperature source Temporal, resp. rate 16, height 5\' 3"  (1.6 m), weight 180 lb 12.8 oz (82 kg), SpO2 96 %. Body mass index is 32.03 kg/m.  Vitals with BMI 11/30/2020 10/06/2020 10/06/2020  Height 5\' 3"  - 5\' 3"   Weight 180 lbs 13 oz - 175 lbs  BMI 32.04 - 31.01  Systolic 148 173 10/08/2020  Diastolic 92 92 102  Pulse 91 - 62    Physical Exam Neck:     Vascular: No carotid bruit or JVD.  Cardiovascular:     Rate and Rhythm: Normal rate and regular rhythm.     Pulses: Intact distal pulses.     Heart sounds: Normal heart sounds. No murmur heard.   No gallop.  Pulmonary:      Effort: Pulmonary effort is normal.     Breath sounds: Normal breath sounds.  Abdominal:     General: Bowel sounds are normal.     Palpations: Abdomen is soft.  Musculoskeletal:        General: No swelling.     Laboratory examination:   External labs:   Cholesterol, total 155.000 m 11/11/2018 HDL 59.000 mg 08/31/2019 LDL 75.000 cal 08/31/2019 Triglycerides 43.000 mg 08/31/2019  TSH 3.160 11/19/2019  Medications and allergies   Allergies  Allergen Reactions   Bystolic [Nebivolol Hcl] Other (See Comments)    Nail change   Hydrocodone Itching    Liquid Solution    Medication prior to this encounter:   Outpatient Medications Prior to Visit  Medication Sig Dispense Refill   BLACK CURRANT SEED OIL PO Take by mouth.     COD LIVER OIL PO Take by mouth daily.     Olmesartan-amLODIPine-HCTZ 40-10-12.5 MG TABS Take 1 tablet by mouth daily. 90 tablet 1   Nebivolol HCl (BYSTOLIC) 20 MG TABS Take 1 tablet (20 mg total) by mouth every evening. (Patient not taking: Reported on 11/30/2020) 90 tablet 3   spironolactone (ALDACTONE) 25 MG tablet Take 1 tablet (25 mg total) by mouth every morning. (Patient not taking: Reported on 11/30/2020) 30 tablet  2   No facility-administered medications prior to visit.    Medication list after today's encounter   Medications prior to this visit Current Meds  Medication Sig   BLACK CURRANT SEED OIL PO Take by mouth.   cloNIDine (CATAPRES - DOSED IN MG/24 HR) 0.1 mg/24hr patch Place 1 patch (0.1 mg total) onto the skin once a week.   COD LIVER OIL PO Take by mouth daily.   Olmesartan-amLODIPine-HCTZ 40-10-12.5 MG TABS Take 1 tablet by mouth daily.    Current Outpatient Medications on File Prior to Visit  Medication Sig Dispense Refill   BLACK CURRANT SEED OIL PO Take by mouth.     COD LIVER OIL PO Take by mouth daily.     Olmesartan-amLODIPine-HCTZ 40-10-12.5 MG TABS Take 1 tablet by mouth daily. 90 tablet 1   No current facility-administered  medications on file prior to visit.    Radiology:   No results found.  Cardiac Studies:   PCV ECHOCARDIOGRAM COMPLETE 08/25/2020  Narrative Echocardiogram 08/25/2020: Normal LV systolic function with visual EF 60-65%. Left ventricle cavity is normal in size. Moderate left ventricular hypertrophy. Normal global wall motion. Normal diastolic filling pattern, normal LAP. Mild (Grade I) mitral regurgitation. Mild tricuspid regurgitation. No evidence of pulmonary hypertension. No prior study for comparison.     EKG:   EKG 08/17/2020: Normal sinus rhythm at rate of 76 bpm, left atrial enlargement, normal axis.  LVH with repolarization abnormality, cannot exclude lateral ischemia. No significant change from 02/15/2017.  Assessment     ICD-10-CM   1. Resistant hypertension  I10 spironolactone (ALDACTONE) 25 MG tablet    cloNIDine (CATAPRES - DOSED IN MG/24 HR) 0.1 mg/24hr patch    2. Palpitations  R00.2       Medications Discontinued During This Encounter  Medication Reason   Nebivolol HCl (BYSTOLIC) 20 MG TABS Side effect (s)   spironolactone (ALDACTONE) 25 MG tablet Reorder    Meds ordered this encounter  Medications   spironolactone (ALDACTONE) 25 MG tablet    Sig: Take 1 tablet (25 mg total) by mouth every morning.    Dispense:  30 tablet    Refill:  2   cloNIDine (CATAPRES - DOSED IN MG/24 HR) 0.1 mg/24hr patch    Sig: Place 1 patch (0.1 mg total) onto the skin once a week.    Dispense:  4 patch    Refill:  3     No orders of the defined types were placed in this encounter.   Recommendations:   Amy Wheeler is a 50 y.o. African-American female patient with hypertension, GERD, hypothyroidism, presents for follow-up of hypertension and palpitations.  She is having multiple medication intolerances, she did not tolerate Bystolic as she had brittle nails and spironolactone caused her to have muscle cramps but she is willing to try this again.  I would like to start her  on Catapres patch 0.1 mg every week.  She is presently tolerating Exforge HCT without any complications and labs have been stable.  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  No clinical evidence of heart failure, she does not have carotid bruit, if her blood pressure is not well controlled we could certainly consider renal artery duplex to exclude FMD.  But I truly believe that this is probably a primary hypertension.  With regard to palpitations, she has had complete  resolution of palpitations since blood pressure has improved.  I will see her back in 2 months for follow-up.  If she remains stable I will see her back on a as needed basis.  Yates Decamp, MD, [facility] 11/30/2020, 10:10 AM Office: [phone]

## 2021-01-10 ENCOUNTER — Ambulatory Visit: Payer: No Typology Code available for payment source | Admitting: Dermatology

## 2021-02-01 ENCOUNTER — Other Ambulatory Visit: Payer: Self-pay | Admitting: Cardiology

## 2021-02-01 DIAGNOSIS — I10 Essential (primary) hypertension: Secondary | ICD-10-CM

## 2021-02-01 DIAGNOSIS — I1A0 Resistant hypertension: Secondary | ICD-10-CM

## 2021-02-02 ENCOUNTER — Other Ambulatory Visit: Payer: Self-pay

## 2021-02-02 ENCOUNTER — Encounter: Payer: Self-pay | Admitting: Cardiology

## 2021-02-02 ENCOUNTER — Ambulatory Visit: Payer: 59 | Admitting: Cardiology

## 2021-02-02 VITALS — BP 143/88 | HR 66 | Temp 98.4°F | Resp 16 | Ht 63.0 in | Wt 180.0 lb

## 2021-02-02 DIAGNOSIS — I10 Essential (primary) hypertension: Secondary | ICD-10-CM

## 2021-02-02 DIAGNOSIS — R002 Palpitations: Secondary | ICD-10-CM

## 2021-02-02 NOTE — Progress Notes (Signed)
Primary Physician/Referring:  Dorisann Frames, MD  Patient ID: Amy Wheeler, female    DOB: [birth year], 51 y.o.   MRN: [id number]  Chief Complaint  Patient presents with   Resistant hypertension    2 month   Follow-up    2 months    HPI:    Amy Wheeler  is a 51 y.o. African-American female patient with hypertension, GERD, hypothyroidism, presents for follow-up of hypertension and palpitations.  On her last office visit had started her on clonidine patch which she is tolerating well, she has noticed marked improvement in blood pressure.  She was out of clonidine patch for the past 3 weeks due to change in insurance.  She works in [employer] and is now working for [employer].  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    Past Medical History:  Diagnosis Date   Graves disease    Hypertension    Past Surgical History:  Procedure Laterality Date   BREAST REDUCTION SURGERY     DILATION AND CURETTAGE OF UTERUS     Family History  Problem Relation Age of Onset   Hypertension Mother     Social History   Tobacco Use   Smoking status: Never   Smokeless tobacco: Never  Substance Use Topics   Alcohol use: Yes    Comment: rarely   Marital Status: Single  ROS  Review of Systems  Cardiovascular:  Negative for chest pain, dyspnea on exertion, leg swelling and palpitations.  Gastrointestinal:  Negative for melena.  Objective  Blood pressure (!) 143/88, pulse 66, temperature 98.4 F (36.9 C), temperature source Temporal, resp. rate 16, height 5\' 3"  (1.6 m), weight 180 lb (81.6 kg), SpO2 98 %. Body mass index is 31.89 kg/m.  Vitals with BMI 02/02/2021 02/02/2021 11/30/2020  Height - 5\' 3"  5\' 3"   Weight - 180 lbs 180 lbs 13 oz  BMI - 31.89 32.04  Systolic 143 158 13/09/2020  Diastolic 88 93 92  Pulse 66 75 91    Physical Exam Neck:     Vascular: No carotid bruit or JVD.  Cardiovascular:     Rate and Rhythm: Normal rate and  regular rhythm.     Pulses: Intact distal pulses.     Heart sounds: Normal heart sounds. No murmur heard.   No gallop.  Pulmonary:     Effort: Pulmonary effort is normal.     Breath sounds: Normal breath sounds.  Abdominal:     General: Bowel sounds are normal.     Palpations: Abdomen is soft.  Musculoskeletal:        General: No swelling.     Laboratory examination:   External labs:   Cholesterol, total 155.000 m 11/11/2018 HDL 59.000 mg 08/31/2019 LDL 75.000 cal 08/31/2019 Triglycerides 43.000 mg 08/31/2019  TSH 3.160 11/19/2019  Medications and allergies   Allergies  Allergen Reactions   Bystolic [Nebivolol Hcl] Other (See Comments)    Nail change   Hydrocodone Itching    Liquid Solution    Medication prior to this encounter:   Outpatient Medications Prior to Visit  Medication Sig Dispense Refill   BLACK CURRANT SEED OIL PO Take by mouth.     cloNIDine (CATAPRES - DOSED IN MG/24 HR) 0.1 mg/24hr patch Place 1 patch (0.1 mg total) onto the skin once a week. 4 patch 3   COD LIVER OIL PO Take by mouth daily.     Olmesartan-amLODIPine-HCTZ 40-10-12.5 MG TABS Take 1 tablet by  mouth daily. 90 tablet 1   spironolactone (ALDACTONE) 25 MG tablet Take 1 tablet (25 mg total) by mouth every morning. 30 tablet 2   No facility-administered medications prior to visit.    Medication list after today's encounter   Medications prior to this visit Current Meds  Medication Sig   BLACK CURRANT SEED OIL PO Take by mouth.   cloNIDine (CATAPRES - DOSED IN MG/24 HR) 0.1 mg/24hr patch Place 1 patch (0.1 mg total) onto the skin once a week.   COD LIVER OIL PO Take by mouth daily.   Olmesartan-amLODIPine-HCTZ 40-10-12.5 MG TABS Take 1 tablet by mouth daily.    Current Outpatient Medications on File Prior to Visit  Medication Sig Dispense Refill   BLACK CURRANT SEED OIL PO Take by mouth.     cloNIDine (CATAPRES - DOSED IN MG/24 HR) 0.1 mg/24hr patch Place 1 patch (0.1 mg total) onto the  skin once a week. 4 patch 3   COD LIVER OIL PO Take by mouth daily.     Olmesartan-amLODIPine-HCTZ 40-10-12.5 MG TABS Take 1 tablet by mouth daily. 90 tablet 1   No current facility-administered medications on file prior to visit.    Radiology:   No results found.  Cardiac Studies:   PCV ECHOCARDIOGRAM COMPLETE 08/25/2020  Narrative Echocardiogram 08/25/2020: Normal LV systolic function with visual EF 60-65%. Left ventricle cavity is normal in size. Moderate left ventricular hypertrophy. Normal global wall motion. Normal diastolic filling pattern, normal LAP. Mild (Grade I) mitral regurgitation. Mild tricuspid regurgitation. No evidence of pulmonary hypertension. No prior study for comparison.     EKG:   EKG 08/17/2020: Normal sinus rhythm at rate of 76 bpm, left atrial enlargement, normal axis.  LVH with repolarization abnormality, cannot exclude lateral ischemia. No significant change from 02/15/2017.  Assessment     ICD-10-CM   1. Primary hypertension  I10 EKG 12-Lead    2. Palpitations  R00.2       Medications Discontinued During This Encounter  Medication Reason   spironolactone (ALDACTONE) 25 MG tablet     No orders of the defined types were placed in this encounter.    Orders Placed This Encounter  Procedures   EKG 12-Lead     Recommendations:   Amy Wheeler is a 51 y.o. African-American female patient with hypertension, GERD, hypothyroidism, presents for follow-up of hypertension and palpitations.  She is having multiple medication intolerances, she is presently tolerating olmesartan/HCTZ/amlodipine combination, on her last office visit had added clonidine patch at 0.1 mg.  Patient has noticed marked improvement in blood pressure, systolic blood pressure has been in 120 range with the diastolic blood pressure around 70-80 range.  Today the blood pressure is elevated, however patient has not been on clonidine patch for the past 3 weeks as she did not have  appropriate insurance.  Hence I did not make any changes to her medications.  From cardiac standpoint she remained stable with well-controlled hypertension, we could certainly increase the dose of clonidine if needed.  She is in medical research, she will continue to monitor her blood pressure closely, will get back to [place] if blood pressure is uncontrolled.  I will see her back on a as needed basis.  Also since being on clonidine she has not had any further palpitations.  Weight loss was discussed, regular exercise was also discussed and increasing aerobic activity.   Yates Decamp, MD, [facility] 02/02/2021, 11:39 AM Office: [phone]

## 2021-02-07 ENCOUNTER — Other Ambulatory Visit: Payer: Self-pay

## 2021-02-07 DIAGNOSIS — I10 Essential (primary) hypertension: Secondary | ICD-10-CM

## 2021-02-07 DIAGNOSIS — I1A0 Resistant hypertension: Secondary | ICD-10-CM

## 2021-02-07 MED ORDER — CLONIDINE 0.1 MG/24HR TD PTWK: 0.1000 mg | MEDICATED_PATCH | TRANSDERMAL | 3 refills | Status: DC

## 2021-11-30 ENCOUNTER — Ambulatory Visit (INDEPENDENT_AMBULATORY_CARE_PROVIDER_SITE_OTHER): Payer: PRIVATE HEALTH INSURANCE | Admitting: Adult Health

## 2021-11-30 ENCOUNTER — Encounter: Payer: Self-pay | Admitting: Adult Health

## 2021-11-30 VITALS — BP 130/90 | HR 91 | Temp 98.3°F | Ht 63.5 in | Wt 181.6 lb

## 2021-11-30 DIAGNOSIS — R0683 Snoring: Secondary | ICD-10-CM

## 2021-11-30 DIAGNOSIS — G4719 Other hypersomnia: Secondary | ICD-10-CM

## 2021-11-30 NOTE — Progress Notes (Signed)
Had J&J covid vaccine only.

## 2021-11-30 NOTE — Patient Instructions (Signed)
Set up for home sleep study  Work on healthy weight loss  Healthy sleep regimen  Do not drive if sleepy  Follow up in 2-3 months to discuss results and treatment plan

## 2021-11-30 NOTE — Assessment & Plan Note (Signed)
Snoring, daytime sleepiness, restless sleep, daytime fatigue all concerning for underlying sleep apnea.  Patient education was given.  We will set patient up for home sleep study.  Plan  Patient Instructions  Set up for home sleep study  Work on healthy weight loss  Healthy sleep regimen  Do not drive if sleepy  Follow up in 2-3 months to discuss results and treatment plan

## 2021-11-30 NOTE — Progress Notes (Signed)
Reviewed and agree with assessment/plan.   Coralyn Helling, MD [facility] Pulmonary/Critical Care 11/30/2021, 12:45 PM Pager:  [phone]

## 2021-11-30 NOTE — Progress Notes (Addendum)
$'@Patient'Z$  ID: Amy Wheeler, female    DOB: 1970/07/17, 51 y.o.   MRN: [id number]  Chief Complaint  Patient presents with   Consult    Referring provider: Jacelyn Pi, MD  HPI: Patient presents for a sleep consult today.  She complains of snoring, restless sleep and daytime sleepiness  TEST/EVENTS :  2D echo August 25, 2020 showed preserved EF, no evidence of pulmonary hypertension, mild mitral valve regurg, moderate left ventricular hypertrophy.  11/30/2021 Sleep consult  Patient presents for sleep consult today.  Kindly referred by her primary care provider Dr. Chalmers Wheeler.  Patient complains of snoring, restless sleep, daytime sleepiness.  Patient says she wakes up multiple times throughout the night and cannot sleep through the night.  Typically goes to bed about 10 PM.  Takes about 45 minutes to go to sleep.  Is up multiple times throughout the night.  Gets up at 8 AM.  Weight is up about 5 pounds over the last 2 years.  Current weight is at 181 pounds with a BMI at 31.  No history of congestive heart failure or stroke.    Caffeine intake 1 cup daily. Does not use sleep aids. Occasional naps. No symptoms suscioup for cataplexy or sleep paralyis [place] 17 out of 24.  Typically gets sleepy if she sits down to watch TV or read in the afternoon hours and after eating. She says she did wants to get a good night sleep and feel better she is tired of being tired all the time  Social history patient is single.  Does not have any children.  Lives by herself.  She is a never smoker. Social alcohol.  No  drug use.  Works in administration.  Family history : Aunt + OSA   Past Surgical History:  Procedure Laterality Date   BREAST REDUCTION SURGERY     DILATION AND CURETTAGE OF UTERUS       Allergies  Allergen Reactions   Bystolic [Nebivolol Hcl] Other (See Comments)    Nail change   Hydrocodone Itching    Liquid Solution     There is no immunization history on file for this  patient.  Past Medical History:  Diagnosis Date   Graves disease    Hypertension     Tobacco History: Social History   Tobacco Use  Smoking Status Never  Smokeless Tobacco Never   Counseling given: Not Answered   Outpatient Medications Prior to Visit  Medication Sig Dispense Refill   BLACK CURRANT SEED OIL PO Take by mouth.     cloNIDine (CATAPRES - DOSED IN MG/24 HR) 0.1 mg/24hr patch Place 1 patch (0.1 mg total) onto the skin once a week. (Patient taking differently: Place 0.1 mg onto the skin as needed.) 30 patch 3   COD LIVER OIL PO Take by mouth daily.     ergocalciferol, VITAMIN D2, (DRISDOL) 200 MCG/ML drops Take 1,250 Units by mouth once a week.     Flaxseed, Linseed, (FLAX SEED OIL) 1000 MG CAPS Take 1 capsule by mouth daily as needed.     MULTIPLE VITAMIN PO Take 1 tablet by mouth as needed.     Olmesartan-amLODIPine-HCTZ 40-10-12.5 MG TABS Take 1 tablet by mouth daily. (Patient not taking: Reported on 11/30/2021) 90 tablet 1   No facility-administered medications prior to visit.     Review of Systems:   Constitutional:   No  weight loss, night sweats,  Fevers, chills, fatigue, or  lassitude.  HEENT:   No headaches,  Difficulty swallowing,  Tooth/dental problems, or  Sore throat,                No sneezing, itching, ear ache, nasal congestion, post nasal drip,   CV:  No chest pain,  Orthopnea, PND, swelling in lower extremities, anasarca, dizziness, palpitations, syncope.   GI  No heartburn, indigestion, abdominal pain, nausea, vomiting, diarrhea, change in bowel habits, loss of appetite, bloody stools.   Resp: No shortness of breath with exertion or at rest.  No excess mucus, no productive cough,  No non-productive cough,  No coughing up of blood.  No change in color of mucus.  No wheezing.  No chest wall deformity  Skin: no rash or lesions.  GU: no dysuria, change in color of urine, no urgency or frequency.  No flank pain, no hematuria   MS:  No joint pain  or swelling.  No decreased range of motion.  No back pain.    Physical Exam  BP (!) 130/90 (BP Location: Left Arm, Patient Position: Sitting, Cuff Size: Normal)   Pulse 91   Temp 98.3 F (36.8 C) (Oral)   Ht 5' 3.5" (1.613 m)   Wt 181 lb 9.6 oz (82.4 kg)   SpO2 99%   BMI 31.66 kg/m   GEN: A/Ox3; pleasant , NAD, well nourished    HEENT:  [place]/AT,   NOSE-clear, THROAT-clear, no lesions, no postnasal drip or exudate noted.  Class III MP airway  NECK:  Supple w/ fair ROM; no JVD; normal carotid impulses w/o bruits; no thyromegaly or nodules palpated; no lymphadenopathy.    RESP  Clear  P & A; w/o, wheezes/ rales/ or rhonchi. no accessory muscle use, no dullness to percussion  CARD:  RRR, no m/r/g, no peripheral edema, pulses intact, no cyanosis or clubbing.  GI:   Soft & nt; nml bowel sounds; no organomegaly or masses detected.   Musco: Warm bil, no deformities or joint swelling noted.   Neuro: alert, no focal deficits noted.    Skin: Warm, no lesions or rashes    Lab Results:      BNP No results found for: "BNP"  ProBNP No results found for: "PROBNP"  Imaging: No results found.        No data to display          No results found for: "NITRICOXIDE"      Assessment & Plan:   Snoring Snoring, daytime sleepiness, restless sleep, daytime fatigue all concerning for underlying sleep apnea.  Patient education was given.  We will set patient up for home sleep study.  Plan  Patient Instructions  Set up for home sleep study  Work on healthy weight loss  Healthy sleep regimen  Do not drive if sleepy  Follow up in 2-3 months to discuss results and treatment plan       Amy Edison, NP 11/30/2021

## 2022-02-28 ENCOUNTER — Telehealth: Payer: Self-pay | Admitting: Adult Health

## 2022-03-06 NOTE — Telephone Encounter (Signed)
HST has been scheduled.  Scheduled follow up appt with TP.  Nothing further needed.

## 2022-03-07 ENCOUNTER — Ambulatory Visit: Payer: Commercial Managed Care - PPO

## 2022-03-07 DIAGNOSIS — R0683 Snoring: Secondary | ICD-10-CM

## 2022-03-07 DIAGNOSIS — G4719 Other hypersomnia: Secondary | ICD-10-CM

## 2022-03-07 DIAGNOSIS — G4733 Obstructive sleep apnea (adult) (pediatric): Secondary | ICD-10-CM

## 2022-03-20 ENCOUNTER — Telehealth: Payer: Self-pay | Admitting: Pulmonary Disease

## 2022-03-20 DIAGNOSIS — G4733 Obstructive sleep apnea (adult) (pediatric): Secondary | ICD-10-CM | POA: Diagnosis not present

## 2022-03-20 NOTE — Telephone Encounter (Signed)
Closing encounter results discussed @ OV per TP

## 2022-03-20 NOTE — Telephone Encounter (Signed)
Thanks has ov on 3/1 to discuss results

## 2022-03-20 NOTE — Telephone Encounter (Signed)
Call patient  Sleep study result  Date of study: 03/07/2022  Impression:  Mild obstructive sleep apnea Mild oxygen desaturations Study may have underestimated sleep apnea severity with evidence of  sensor malpositioning   Recommendation:  Options of treatment for mild obstructive sleep apnea will include  1.  CPAP therapy if there is significant daytime sleepiness or other comorbidities including history of CVA or cardiac disease  -If CPAP is chosen as an option of treatment auto titrating CPAP with a pressure setting of 5-15 will be appropriate  2.  Watchful waiting with emphasis on weight loss measures, sleep position modification to optimize lateral sleep, elevating the head of the bed by about 30 degrees may also help.  3.  An oral device may be fashioned for the treatment of mild sleep disordered breathing, will involve referral to dentist.   Follow-up as previously scheduled

## 2022-03-23 ENCOUNTER — Ambulatory Visit (INDEPENDENT_AMBULATORY_CARE_PROVIDER_SITE_OTHER): Payer: 59 | Admitting: Adult Health

## 2022-03-23 ENCOUNTER — Encounter: Payer: Self-pay | Admitting: Adult Health

## 2022-03-23 VITALS — BP 130/100 | HR 75 | Temp 98.2°F | Ht 63.5 in | Wt 183.2 lb

## 2022-03-23 DIAGNOSIS — G4733 Obstructive sleep apnea (adult) (pediatric): Secondary | ICD-10-CM

## 2022-03-23 DIAGNOSIS — I10 Essential (primary) hypertension: Secondary | ICD-10-CM

## 2022-03-23 NOTE — Progress Notes (Signed)
$'@Patient'N$  ID: Amy Wheeler, female    DOB: [birth year], 52 y.o.   MRN: [id number]  Chief Complaint  Patient presents with   Follow-up    Referring provider: Jacelyn Pi, MD  HPI: 52 year old female seen for sleep consult November 30, 2021 for snoring, restless sleep and daytime sleepiness found to have mild obstructive sleep apnea  TEST/EVENTS :  Home sleep study March 07, 2022-Mild obstructive sleep apnea Mild oxygen desaturations, Study may have underestimated sleep apnea severity with evidence of  sensor malpositioning  03/23/2022 Follow up : OSA  Patient presents for a follow-up visit.  Patient was seen in November for sleep consult.  She had snoring, restless sleep and daytime sleepiness.  She was set up for a home sleep study that was done March 07, 2022 that showed mild sleep apnea.  AHI was 6.6 with a SpO2 low at 85%.  Study was reviewed in detail.  Notations on study showed sleep apnea may have been underestimated as evidence of sensor malpositioning. Currently has braces. Wants to proceed with CPAP .   Allergies  Allergen Reactions   Bystolic [Nebivolol Hcl] Other (See Comments)    Nail change   Hydrocodone Itching    Liquid Solution    Immunization History  Administered Date(s) Administered   Janssen (J&J) SARS-COV-2 Vaccination 03/22/2019    Past Medical History:  Diagnosis Date   Graves disease    Hypertension     Tobacco History: Social History   Tobacco Use  Smoking Status Never  Smokeless Tobacco Never   Counseling given: Not Answered   Outpatient Medications Prior to Visit  Medication Sig Dispense Refill   BLACK CURRANT SEED OIL PO Take by mouth.     cloNIDine (CATAPRES - DOSED IN MG/24 HR) 0.1 mg/24hr patch Place 1 patch (0.1 mg total) onto the skin once a week. (Patient taking differently: Place 0.1 mg onto the skin as needed.) 30 patch 3   COD LIVER OIL PO Take by mouth daily.     ergocalciferol, VITAMIN D2, (DRISDOL) 200 MCG/ML drops  Take 1,250 Units by mouth once a week.     Flaxseed, Linseed, (FLAX SEED OIL) 1000 MG CAPS Take 1 capsule by mouth daily as needed.     MULTIPLE VITAMIN PO Take 1 tablet by mouth as needed.     Olmesartan-amLODIPine-HCTZ 40-10-12.5 MG TABS Take 1 tablet by mouth daily. 90 tablet 1   No facility-administered medications prior to visit.     Review of Systems:   Constitutional:   No  weight loss, night sweats,  Fevers, chills, + fatigue, or  lassitude.  HEENT:   No headaches,  Difficulty swallowing,  Tooth/dental problems, or  Sore throat,                No sneezing, itching, ear ache, nasal congestion, post nasal drip,   CV:  No chest pain,  Orthopnea, PND, swelling in lower extremities, anasarca, dizziness, palpitations, syncope.   GI  No heartburn, indigestion, abdominal pain, nausea, vomiting, diarrhea, change in bowel habits, loss of appetite, bloody stools.   Resp: No shortness of breath with exertion or at rest.  No excess mucus, no productive cough,  No non-productive cough,  No coughing up of blood.  No change in color of mucus.  No wheezing.  No chest wall deformity  Skin: no rash or lesions.  GU: no dysuria, change in color of urine, no urgency or frequency.  No flank pain, no hematuria   MS:  No  joint pain or swelling.  No decreased range of motion.  No back pain.    Physical Exam  BP (!) 130/100 (BP Location: Left Arm, Patient Position: Sitting, Cuff Size: Normal) Comment: pt. has called cardiologist  Pulse 75   Temp 98.2 F (36.8 C) (Oral)   Ht 5' 3.5" (1.613 m)   Wt 183 lb 3.2 oz (83.1 kg)   SpO2 99%   BMI 31.94 kg/m   GEN: A/Ox3; pleasant , NAD, well nourished    HEENT:  [place]/AT,  NOSE-clear, THROAT-clear, no lesions, no postnasal drip or exudate noted.  Class 2-3 MP airway , braces   NECK:  Supple w/ fair ROM; no JVD; normal carotid impulses w/o bruits; no thyromegaly or nodules palpated; no lymphadenopathy.    RESP  Clear  P & A; w/o, wheezes/ rales/ or  rhonchi. no accessory muscle use, no dullness to percussion  CARD:  RRR, no m/r/g, no peripheral edema, pulses intact, no cyanosis or clubbing.  GI:   Soft & nt; nml bowel sounds; no organomegaly or masses detected.   Musco: Warm bil, no deformities or joint swelling noted.   Neuro: alert, no focal deficits noted.    Skin: Warm, no lesions or rashes    Lab Results:   BNP No results found for: "BNP"  ProBNP No results found for: "PROBNP"  Imaging: No results found.        No data to display          No results found for: "NITRICOXIDE"      Assessment & Plan:   OSA (obstructive sleep apnea) Mild obstructive sleep apnea with significant symptom burden-patient will begin on CPAP at bedtime.  Will use a DreamWear nasal mask.  Begin CPAP AutoSet 5 to 15 cm H2O.  She is not a candidate for oral appliance as she has braces  Plan  Patient Instructions  Follow-up with primary care regarding elevated blood pressure  Begin CPAP At bedtime , wear all night long for at least 6hr  Work on healthy weight loss  Do not drive if sleepy  Follow up in 3 months and As needed       Primary hypertension Discussed with patient to follow-up for elevated blood pressure with her primary care provider.     Rexene Edison, NP 03/23/2022

## 2022-03-23 NOTE — Progress Notes (Signed)
Had the J&J vaccine and no boosters.  No flu vaccine.

## 2022-03-23 NOTE — Patient Instructions (Addendum)
Follow-up with primary care regarding elevated blood pressure  Begin CPAP At bedtime , wear all night long for at least 6hr  Work on healthy weight loss  Do not drive if sleepy  Follow up in 3 months and As needed

## 2022-03-23 NOTE — Progress Notes (Signed)
Reviewed and agree with assessment/plan.   Chesley Mires, MD [facility] Pulmonary/Critical Care 03/23/2022, 10:20 AM Pager:  [phone]

## 2022-03-23 NOTE — Assessment & Plan Note (Signed)
Mild obstructive sleep apnea with significant symptom burden-patient will begin on CPAP at bedtime.  Will use a DreamWear nasal mask.  Begin CPAP AutoSet 5 to 15 cm H2O.  She is not a candidate for oral appliance as she has braces  Plan  Patient Instructions  Follow-up with primary care regarding elevated blood pressure  Begin CPAP At bedtime , wear all night long for at least 6hr  Work on healthy weight loss  Do not drive if sleepy  Follow up in 3 months and As needed

## 2022-03-23 NOTE — Assessment & Plan Note (Signed)
Discussed with patient to follow-up for elevated blood pressure with her primary care provider.

## 2022-03-23 NOTE — Addendum Note (Signed)
Addended by: Vanessa Barbara on: 03/23/2022 10:28 AM   Modules accepted: Orders

## 2022-04-02 ENCOUNTER — Ambulatory Visit: Payer: Commercial Managed Care - PPO | Admitting: Cardiology

## 2022-04-02 ENCOUNTER — Encounter: Payer: Self-pay | Admitting: Cardiology

## 2022-04-02 VITALS — BP 188/105 | HR 61 | Ht 63.5 in | Wt 179.8 lb

## 2022-04-02 DIAGNOSIS — I10 Essential (primary) hypertension: Secondary | ICD-10-CM

## 2022-04-02 DIAGNOSIS — I119 Hypertensive heart disease without heart failure: Secondary | ICD-10-CM

## 2022-04-02 MED ORDER — OLMESARTAN MEDOXOMIL-HCTZ 40-25 MG PO TABS: 1.0000 | ORAL_TABLET | ORAL | 2 refills | Status: DC

## 2022-04-02 NOTE — Progress Notes (Signed)
Primary Physician/Referring:  Jacelyn Pi, MD  Patient ID: Amy Wheeler, female    DOB: [birth year], 52 y.o.   MRN: [id number]  Chief Complaint  Patient presents with   Hypertension   Follow-up    HPI:    Amy Wheeler  is a 52 y.o. African-American female patient with hypertension, GERD, hypothyroidism, presents for follow-up of hypertension and palpitations.  On previous visit, she was tolerating clonidine patch as well as olmesartan/HCTZ/amlodipine with remarkable improvement in blood pressure. She has been out of her olmesartan/HCTZ/amlodipine for a while. She works in [employer] and is now working for [employer].  Works Designer, television/film set. She is asymptomatic otherwise and continues to exercise regularly.    Recently 11/30/2021, she was seen by pulmonology and diagnosed with mild OSA with recommendations of starting CPAP therapy. As of this visit, she has not started CPAP therapy.   Past Medical History:  Diagnosis Date   Graves disease    Hypertension    Past Surgical History:  Procedure Laterality Date   BREAST REDUCTION SURGERY     DILATION AND CURETTAGE OF UTERUS     Family History  Problem Relation Age of Onset   Hypertension Mother     Social History   Tobacco Use   Smoking status: Never   Smokeless tobacco: Never  Substance Use Topics   Alcohol use: Yes    Comment: rarely   Marital Status: Single  ROS  Review of Systems  Constitutional:  Negative for chills and fever.  Respiratory:  Negative for cough.   Cardiovascular:  Negative for chest pain and palpitations.  Gastrointestinal: Negative.   Neurological: Negative.   Psychiatric/Behavioral: Negative.      Objective  Blood pressure (!) 188/105, pulse 61, height 5' 3.5" (1.613 m), weight 81.6 kg, SpO2 97 %. Body mass index is 31.35 kg/m.     04/02/2022   12:13 PM 04/02/2022   12:06 PM 03/23/2022    9:51 AM  Vitals with BMI  Height  5' 3.5" 5' 3.5"  Weight  179 lbs 13 oz 183 lbs 3 oz  BMI   [id number]  Systolic 0000000 [id number]  Diastolic 123456 [id number]  Pulse 61 73 75    Physical Exam  Constitutional: She appears healthy. No distress.  Neck: No JVD present.  Pulmonary/Chest: Effort normal and breath sounds normal.  Abdominal: Soft. Bowel sounds are normal. She exhibits no distension. There is no abdominal tenderness.  Neurological: She is alert.  Skin: Skin is warm and dry.     Laboratory examination:   External labs:   Cholesterol, total 155.000 m 11/11/2018 HDL 59.000 mg 08/31/2019 LDL 75.000 cal 08/31/2019 Triglycerides 43.000 mg 08/31/2019  TSH 3.160 11/19/2019  Medications and allergies   Allergies  Allergen Reactions   Bystolic [Nebivolol Hcl] Other (See Comments)    Nail change   Hydrocodone Itching    Liquid Solution    Medication    Current Outpatient Medications on File Prior to Visit  Medication Sig Dispense Refill   BLACK CURRANT SEED OIL PO Take by mouth.     cloNIDine (CATAPRES - DOSED IN MG/24 HR) 0.1 mg/24hr patch Place 1 patch (0.1 mg total) onto the skin once a week. (Patient taking differently: Place 0.1 mg onto the skin as needed.) 30 patch 3   COD LIVER OIL PO Take by mouth daily.     ergocalciferol, VITAMIN D2, (DRISDOL) 200 MCG/ML drops Take 1,250 Units by mouth once a week.  Flaxseed, Linseed, (FLAX SEED OIL) 1000 MG CAPS Take 1 capsule by mouth daily as needed.     NON FORMULARY Take 1 tablet by mouth daily. ARJUNA     No current facility-administered medications on file prior to visit.      Radiology:   No results found.  Cardiac Studies:   PCV ECHOCARDIOGRAM COMPLETE 08/25/2020  Narrative Echocardiogram 08/25/2020: Normal LV systolic function with visual EF 60-65%. Left ventricle cavity is normal in size. Moderate left ventricular hypertrophy. Normal global wall motion. Normal diastolic filling pattern, normal LAP. Mild (Grade I) mitral regurgitation. Mild tricuspid regurgitation. No evidence of pulmonary hypertension. No  prior study for comparison.     EKG:   EKG 03/29/2022: Normal sinus rhythm at rate of 58 bpm, left atrial enlargement, LVH with repolarization abnormality, cannot exclude lateral ischemia.  Compared to 08/17/2020, no significant change.  Text Interpretation:    Assessment     ICD-10-CM   1. Primary hypertension  I10 EKG 12-Lead    olmesartan-hydrochlorothiazide (BENICAR HCT) 40-25 MG tablet    Basic metabolic panel    2. Hypertensive heart disease without heart failure  I11.9       Medications Discontinued During This Encounter  Medication Reason   MULTIPLE VITAMIN PO Completed Course   Olmesartan-amLODIPine-HCTZ 40-10-12.5 MG TABS Completed Course   Olmesartan-amLODIPine-HCTZ 40-5-25 MG TABS Patient Preference   Meds ordered this encounter  Medications   olmesartan-hydrochlorothiazide (BENICAR HCT) 40-25 MG tablet    Sig: Take 1 tablet by mouth every morning.    Dispense:  30 tablet    Refill:  2   Orders Placed This Encounter  Procedures   Basic metabolic panel   EKG [id number]   Recommendations:   Jany Sloboda is a 52 y.o. African-American female patient with hypertension, GERD, hypothyroidism, presents for follow-up of hypertension and palpitations.  She is having multiple medication intolerances, she was tolerating olmesartan/HCTZ/amlodipine combination and clonidine patch 0.1 mg with remarkable improvement in blood pressure. Today's visit  04/02/2022, blood pressure is elevateshe states she has not been taking her olmesartan/HCTZ/amlodipine combination due to the prescription running out. Since then she was evaluated by pulmonology and diagnosed with mild OSA. She has not started CPAP therapy at this time. She also expresses her concerns about weight loss and interest in weight loss medications. I have educated her on the importance of healthy eating habits and exercise as this would be long term management of weight loss.  1. Primary hypertension 2. Hypertensive heart  disease without heart failure From a cardiac standpoint, I have ordered olmesartan-hydrochlorothiazide (BENICAR HCT) 40-25 MG tablet with instructions to take 1 tablet by mouth every morning. I also want her to continue the clonodine patch 0.1 mg. She will continue to monitor her blood pressure closely, will get back to [place] if blood pressure is uncontrolled. Also encouraged to continue weight loss and exercise management. She is scheduled for outpatient lab work and will follow up in office in approx 6 weeks.       Erma Heritage, NP-S

## 2022-05-15 ENCOUNTER — Encounter: Payer: Self-pay | Admitting: Cardiology

## 2022-05-15 ENCOUNTER — Ambulatory Visit: Payer: 59 | Admitting: Cardiology

## 2022-05-15 DIAGNOSIS — I1A0 Resistant hypertension: Secondary | ICD-10-CM

## 2022-05-15 MED ORDER — CLONIDINE 0.2 MG/24HR TD PTWK: 0.2000 mg | MEDICATED_PATCH | TRANSDERMAL | 1 refills | Status: DC

## 2022-05-15 MED ORDER — OLMESARTAN-AMLODIPINE-HCTZ 40-10-25 MG PO TABS
1.0000 | ORAL_TABLET | ORAL | 1 refills | Status: DC
Start: 2022-05-15 — End: 2022-12-19

## 2022-05-15 NOTE — Progress Notes (Signed)
Primary Physician/Referring:  Dorisann Frames, MD  Patient ID: Amy Wheeler, female    DOB: 03/11/1970, 52 y.o.   MRN: [id number]  Chief Complaint  Patient presents with   Primary hypertension   Follow-up   HPI:    Amy Wheeler  is a 52 y.o. African-American female patient with hypertension, GERD, hypothyroidism, mild OSA now using CPAP regularly.  Presents for follow-up of hypertension.  On previous visit, she was tolerating clonidine patch as well as olmesartan/HCTZ/amlodipine with remarkable improvement in blood pressure. She has been out of her olmesartan/HCTZ/amlodipine for a while. She works in [employer] and is now working for [employer].  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    Recently 11/30/2021, she was seen by pulmonology and diagnosed with mild OSA with recommendations of starting CPAP therapy. As of this visit, she has not started CPAP therapy.    Past Medical History:  Diagnosis Date   Graves disease    Hypertension    Past Surgical History:  Procedure Laterality Date   BREAST REDUCTION SURGERY     DILATION AND CURETTAGE OF UTERUS     Family History  Problem Relation Age of Onset   Hypertension Mother     Social History   Tobacco Use   Smoking status: Never   Smokeless tobacco: Never  Substance Use Topics   Alcohol use: Yes    Comment: rarely   Marital Status: Single  ROS  Review of Systems  Cardiovascular:  Negative for chest pain, dyspnea on exertion and leg swelling.   Objective      05/15/2022   11:31 AM 04/02/2022   12:13 PM 04/02/2022   12:06 PM  Vitals with BMI  Height 5' 3.5"  5' 3.5"  Weight 178 lbs  179 lbs 13 oz  BMI 31.03  31.35  Systolic 182 188 086  Diastolic 90 105 108  Pulse 65 61 73   SpO2: 98 %  Physical Exam Neck:     Vascular: No carotid bruit or JVD.  Cardiovascular:     Rate and Rhythm: Normal rate and regular rhythm.     Pulses: Intact distal pulses.      Heart sounds: Normal heart sounds. No murmur heard.    No gallop.  Pulmonary:     Effort: Pulmonary effort is normal.     Breath sounds: Normal breath sounds.  Abdominal:     General: Bowel sounds are normal.     Palpations: Abdomen is soft.  Musculoskeletal:     Right lower leg: No edema.     Left lower leg: No edema.     Laboratory examination:   External labs:   A1C 6.2% 11/22/2021   TSH 1.600 12/01/2020  Cholesterol, total 155.000 m 11/11/2018 HDL 59.000 mg 08/31/2019 LDL 75.000 cal 08/31/2019 Triglycerides 43.000 mg 08/31/2019   Radiology:    Cardiac Studies:   PCV ECHOCARDIOGRAM COMPLETE 08/25/2020  Narrative Echocardiogram 08/25/2020: Normal LV systolic function with visual EF 60-65%. Left ventricle cavity is normal in size. Moderate left ventricular hypertrophy. Normal global wall motion. Normal diastolic filling pattern, normal LAP. Mild (Grade I) mitral regurgitation. Mild tricuspid regurgitation. No evidence of pulmonary hypertension. No prior study for comparison.    EKG:   EKG 03/29/2022: Normal sinus rhythm at rate of 58 bpm, left atrial enlargement, LVH with repolarization abnormality, cannot exclude lateral ischemia.  Compared to 08/17/2020, no significant change.   Medications and allergies   Allergies  Allergen Reactions  Bystolic [Nebivolol Hcl] Other (See Comments)    Nail change   Hydrocodone Itching    Liquid Solution     Medication list   Current Outpatient Medications:    BLACK CURRANT SEED OIL PO, Take by mouth., Disp: , Rfl:    COD LIVER OIL PO, Take by mouth daily., Disp: , Rfl:    NON FORMULARY, Take 1 tablet by mouth daily. ARJUNA, Disp: , Rfl:    Olmesartan-amLODIPine-HCTZ (TRIBENZOR) 40-10-25 MG TABS, Take 1 tablet by mouth every morning., Disp: 90 tablet, Rfl: 1   cloNIDine (CATAPRES - DOSED IN MG/24 HR) 0.2 mg/24hr patch, Place 1 patch (0.2 mg total) onto the skin once a week., Disp: 12 patch, Rfl: 1  Assessment      ICD-10-CM   1. 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   Meds ordered this encounter  Medications   cloNIDine (CATAPRES - DOSED IN MG/24 HR) 0.2 mg/24hr patch    Sig: Place 1 patch (0.2 mg total) onto the skin once a week.    Dispense:  12 patch    Refill:  1   Olmesartan-amLODIPine-HCTZ (TRIBENZOR) 40-10-25 MG TABS    Sig: Take 1 tablet by mouth every morning.    Dispense:  90 tablet    Refill:  1    Medications Discontinued During This Encounter  Medication Reason   ergocalciferol, VITAMIN D2, (DRISDOL) 200 MCG/ML drops Patient Preference   Flaxseed, Linseed, (FLAX SEED OIL) 1000 MG CAPS    olmesartan-hydrochlorothiazide (BENICAR HCT) 40-25 MG tablet Change in therapy   cloNIDine (CATAPRES - DOSED IN MG/24 HR) 0.1 mg/24hr patch Reorder     Recommendations:   Amy Wheeler is a 52 y.o. African-American female patient with hypertension, GERD, hypothyroidism, mild OSA now using CPAP regularly.  Presents for follow-up of hypertension.  1. Resistant hypertension Patient presently tolerating olmesartan HCT 40/25 mg without any side effects.  She is also tolerating clonidine patches.  Blood pressure is still not well-controlled, will increase clonidine from 0.1 mg to 0.2 mg patches daily and previously she was on Tribenzor and she had responded well to this.  Hence I will discontinue olmesartan HCT 40/25 mg in the morning and switch her to Tribenzor 40/10/25 mg daily.  Will request Dr. Talmage Nap she is doing labs soon to consider adding renin: Aldosterone ratio to exclude secondary hypertension.  If blood pressure is not controlled on the above medical regimen, could also consider renal artery duplex.  I will see her back in 3 months for follow-up.  If blood pressure is well-controlled upon follow-up with her PCP,  she could also certainly make her visits on a as needed basis.  Patient has extreme anxiety and feels she is overworked, she has difficulty with coping mechanisms, I have recommended that she seek counseling which may help with blood pressure control as well.  25-minute office visit encounter.  - cloNIDine (CATAPRES - DOSED IN MG/24 HR) 0.2 mg/24hr patch; Place 1 patch (0.2 mg total) onto the skin once a week.  Dispense: 12 patch; Refill: 1 - Olmesartan-amLODIPine-HCTZ (TRIBENZOR) 40-10-25 MG TABS; Take 1 tablet by mouth every morning.  Dispense: 90 tablet; Refill: 1     Yates Decamp, MD, [facility] 05/15/2022, 12:29 PM Office: [phone]

## 2022-06-25 ENCOUNTER — Ambulatory Visit (INDEPENDENT_AMBULATORY_CARE_PROVIDER_SITE_OTHER): Payer: 59 | Admitting: Adult Health

## 2022-06-25 ENCOUNTER — Encounter: Payer: Self-pay | Admitting: Adult Health

## 2022-06-25 VITALS — BP 130/90 | HR 77 | Ht 63.0 in | Wt 173.4 lb

## 2022-06-25 DIAGNOSIS — G4733 Obstructive sleep apnea (adult) (pediatric): Secondary | ICD-10-CM | POA: Diagnosis not present

## 2022-06-25 NOTE — Progress Notes (Signed)
@Patient  ID: Amy Wheeler, female    DOB: 1971/01/18, 52 y.o.   MRN: [id number]  Chief Complaint  Patient presents with   Follow-up    Referring provider: Dorisann Frames, MD  HPI: 52 year old female seen for sleep consult November 30, 2021 for snoring and daytime sleepiness found to have mild obstructive sleep apnea  TEST/EVENTS :  Home sleep study March 07, 2022-Mild obstructive sleep apnea Mild oxygen desaturations, Study may have underestimated sleep apnea severity with evidence of  sensor malpositioning  06/25/2022 Follow up : OSA  Patient returns for 46-month follow-up.  She was seen last visit after recent home sleep study in February showed mild sleep apnea.  Patient has significant symptom burden with snoring, restless sleep and daytime sleepiness.  She was started on CPAP therapy.  Patient says that she has been doing pretty well on CPAP.  Is having trouble getting used to it and sometimes is disruptive to her sleep.  However she is trying. Marland Kitchen  CPAP download shows good compliance with 87% usage.  Daily average usage at 6 hours.  Patient is on auto CPAP 5 to 15 cm H2O.  AHI is 2.6/hour. Feels that she does benefits from CPAP with decreased daytime sleepiness.  Does feel pressure is too high at times and she can hear air leaking.   Allergies  Allergen Reactions   Bystolic [Nebivolol Hcl] Other (See Comments)    Nail change   Hydrocodone Itching    Liquid Solution    Immunization History  Administered Date(s) Administered   Janssen (J&J) SARS-COV-2 Vaccination 03/22/2019    Past Medical History:  Diagnosis Date   Graves disease    Hypertension     Tobacco History: Social History   Tobacco Use  Smoking Status Never  Smokeless Tobacco Never   Counseling given: Not Answered   Outpatient Medications Prior to Visit  Medication Sig Dispense Refill   BLACK CURRANT SEED OIL PO Take by mouth.     cloNIDine (CATAPRES - DOSED IN MG/24 HR) 0.2 mg/24hr patch Place 1  patch (0.2 mg total) onto the skin once a week. 12 patch 1   COD LIVER OIL PO Take by mouth daily.     NON FORMULARY Take 1 tablet by mouth daily. ARJUNA     Olmesartan-amLODIPine-HCTZ (TRIBENZOR) 40-10-25 MG TABS Take 1 tablet by mouth every morning. 90 tablet 1   No facility-administered medications prior to visit.     Review of Systems:   Constitutional:   No  weight loss, night sweats,  Fevers, chills,  +fatigue, or  lassitude.  HEENT:   No headaches,  Difficulty swallowing,  Tooth/dental problems, or  Sore throat,                No sneezing, itching, ear ache, nasal congestion, post nasal drip,   CV:  No chest pain,  Orthopnea, PND, swelling in lower extremities, anasarca, dizziness, palpitations, syncope.   GI  No heartburn, indigestion, abdominal pain, nausea, vomiting, diarrhea, change in bowel habits, loss of appetite, bloody stools.   Resp: No shortness of breath with exertion or at rest.  No excess mucus, no productive cough,  No non-productive cough,  No coughing up of blood.  No change in color of mucus.  No wheezing.  No chest wall deformity  Skin: no rash or lesions.  GU: no dysuria, change in color of urine, no urgency or frequency.  No flank pain, no hematuria   MS:  No joint pain or swelling.  No decreased range of motion.  No back pain.    Physical Exam  BP (!) 130/90 (BP Location: Left Arm, Patient Position: Sitting, Cuff Size: Normal) Comment: no BP meds yet this am  Pulse 77   Ht 5\' 3"  (1.6 m)   Wt 173 lb 6.4 oz (78.7 kg)   SpO2 99%   BMI 30.72 kg/m   GEN: A/Ox3; pleasant , NAD, well nourished    HEENT:  [place]/AT,   NOSE-clear, THROAT-clear, no lesions, no postnasal drip or exudate noted. Class 2 MP airway   NECK:  Supple w/ fair ROM; no JVD; normal carotid impulses w/o bruits; no thyromegaly or nodules palpated; no lymphadenopathy.    RESP  Clear  P & A; w/o, wheezes/ rales/ or rhonchi. no accessory muscle use, no dullness to percussion  CARD:   RRR, no m/r/g, no peripheral edema, pulses intact, no cyanosis or clubbing.  GI:   Soft & nt; nml bowel sounds; no organomegaly or masses detected.   Musco: Warm bil, no deformities or joint swelling noted.   Neuro: alert, no focal deficits noted.    Skin: Warm, no lesions or rashes    Lab Results:  CBC   BMET   BNP No results found for: "BNP"  ProBNP No results found for: "PROBNP"  Imaging: No results found.        No data to display          No results found for: "NITRICOXIDE"      Assessment & Plan:   OSA (obstructive sleep apnea) Excellent control compliance on CPAP.  Patient does report perceived benefit since starting CPAP with decreased daytime sleepiness.  Will adjust CPAP pressure for comfort.  Plan  Patient Instructions  Work on CPAP At bedtime , wear all night long for at least 6hr  Decrease CPAP pressure to auto CPAP 5 to 10cmH2O.  Work on healthy weight loss  Do not drive if sleepy  Follow up in 6 months and As needed        Marathon Oil, NP 06/25/2022

## 2022-06-25 NOTE — Assessment & Plan Note (Signed)
Excellent control compliance on CPAP.  Patient does report perceived benefit since starting CPAP with decreased daytime sleepiness.  Will adjust CPAP pressure for comfort.  Plan  Patient Instructions  Work on CPAP At bedtime , wear all night long for at least 6hr  Decrease CPAP pressure to auto CPAP 5 to 10cmH2O.  Work on healthy weight loss  Do not drive if sleepy  Follow up in 6 months and As needed

## 2022-06-25 NOTE — Patient Instructions (Signed)
Work on CPAP At bedtime , wear all night long for at least 6hr  Decrease CPAP pressure to auto CPAP 5 to 10cmH2O.  Work on healthy weight loss  Do not drive if sleepy  Follow up in 6 months and As needed

## 2022-08-01 ENCOUNTER — Telehealth: Payer: Self-pay | Admitting: Adult Health

## 2022-08-01 NOTE — Telephone Encounter (Signed)
Pt states she is unable to get any comfortable sleep with the pressure setting on CPAP pls advise

## 2022-08-10 NOTE — Telephone Encounter (Signed)
Patient is returning phone call. Patient phone number is [phone].

## 2022-08-10 NOTE — Telephone Encounter (Signed)
Called patient but she did not answer. Left message for patient to call back.  

## 2022-08-13 NOTE — Telephone Encounter (Signed)
ATC patient.  LM  to call back. 

## 2022-08-14 ENCOUNTER — Ambulatory Visit: Payer: 59 | Admitting: Cardiology

## 2022-09-25 ENCOUNTER — Encounter: Payer: Self-pay | Admitting: Adult Health

## 2022-09-26 ENCOUNTER — Ambulatory Visit: Payer: 59 | Admitting: Cardiology

## 2022-09-26 ENCOUNTER — Encounter: Payer: Self-pay | Admitting: Cardiology

## 2022-09-26 VITALS — BP 130/84 | HR 86 | Resp 16 | Ht 63.0 in | Wt 176.5 lb

## 2022-09-26 DIAGNOSIS — I119 Hypertensive heart disease without heart failure: Secondary | ICD-10-CM

## 2022-09-26 DIAGNOSIS — I10 Essential (primary) hypertension: Secondary | ICD-10-CM

## 2022-09-26 NOTE — Progress Notes (Signed)
Primary Physician/Referring:  Dorisann Frames, MD  Patient ID: Amy Wheeler, female    DOB: 06-11-1970, 52 y.o.   MRN: [id number]  Chief Complaint  Patient presents with   Resistant hypertension   Follow-up    3 months   HPI:    Amy Wheeler  is a 52 y.o. African-American female patient with hypertension, GERD, hypothyroidism, mild OSA now using CPAP regularly.  Presents for follow-up of hypertension.  She is presently doing well and remains asymptomatic and is tolerating clonidine patch and also amlodipine/olmesartan/HCTZ at max dose.  No specific complaints today.  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    Past Medical History:  Diagnosis Date   Graves disease    Hypertension    Past Surgical History:  Procedure Laterality Date   BREAST REDUCTION SURGERY     DILATION AND CURETTAGE OF UTERUS     Family History  Problem Relation Age of Onset   Hypertension Mother     Social History   Tobacco Use   Smoking status: Never   Smokeless tobacco: Never  Substance Use Topics   Alcohol use: Yes    Comment: rarely   Marital Status: Single  ROS  Review of Systems  Cardiovascular:  Negative for chest pain, dyspnea on exertion and leg swelling.   Objective      09/26/2022    3:58 PM 09/26/2022    3:45 PM 06/25/2022    8:54 AM  Vitals with BMI  Height  5\' 3"  5\' 3"   Weight  176 lbs 8 oz 173 lbs 6 oz  BMI  31.27 30.72  Systolic 130 141 782  Diastolic 84 87 90  Pulse 86 86 77   SpO2: 98 %  Physical Exam Neck:     Vascular: No carotid bruit or JVD.  Cardiovascular:     Rate and Rhythm: Normal rate and regular rhythm.     Pulses: Intact distal pulses.     Heart sounds: Normal heart sounds. No murmur heard.    No gallop.  Pulmonary:     Effort: Pulmonary effort is normal.     Breath sounds: Normal breath sounds.  Abdominal:     General:  Bowel sounds are normal.     Palpations: Abdomen is soft.  Musculoskeletal:     Right lower leg: No edema.     Left lower leg: No edema.     Laboratory examination:   External labs:   A1C 6.2% 11/22/2021   TSH 1.600 12/01/2020  Cholesterol, total 155.000 m 11/11/2018 HDL 59.000 mg 08/31/2019 LDL 75.000 cal 08/31/2019 Triglycerides 43.000 mg 08/31/2019   Radiology:    Cardiac Studies:   PCV ECHOCARDIOGRAM COMPLETE 08/25/2020  Narrative Echocardiogram 08/25/2020: Normal LV systolic function with visual EF 60-65%. Left ventricle cavity is normal in size. Moderate left ventricular hypertrophy. Normal global wall motion. Normal diastolic filling pattern, normal LAP. Mild (Grade I) mitral regurgitation. Mild tricuspid regurgitation. No evidence of pulmonary hypertension. No prior study for comparison.    EKG:   EKG 03/29/2022: Normal sinus rhythm at rate of 58 bpm, left atrial enlargement, LVH with repolarization abnormality, cannot exclude lateral ischemia.  Compared to 08/17/2020, no significant change.   Medications and allergies   Allergies  Allergen Reactions   Bystolic [Nebivolol Hcl] Other (See Comments)    Nail change   Hydrocodone Itching    Liquid Solution  Medication list   Current Outpatient Medications:    BLACK CURRANT SEED OIL PO, Take by mouth., Disp: , Rfl:    clobetasol ointment (TEMOVATE) 0.05 %, Apply 1 Application topically 2 (two) times daily., Disp: , Rfl:    cloNIDine (CATAPRES - DOSED IN MG/24 HR) 0.2 mg/24hr patch, Place 1 patch (0.2 mg total) onto the skin once a week., Disp: 12 patch, Rfl: 1   COD LIVER OIL PO, Take by mouth daily., Disp: , Rfl:    minoxidil (LONITEN) 2.5 MG tablet, Take 2.5 mg by mouth daily., Disp: , Rfl:    NON FORMULARY, Take 1 tablet by mouth daily. ARJUNA, Disp: , Rfl:    Olmesartan-amLODIPine-HCTZ (TRIBENZOR) 40-10-25 MG TABS, Take 1 tablet by mouth every morning., Disp: 90 tablet, Rfl: 1   Vitamin D, Ergocalciferol,  (DRISDOL) 1.25 MG (50000 UNIT) CAPS capsule, Take 50,000 Units by mouth once a week., Disp: , Rfl:   Assessment     ICD-10-CM   1. Primary hypertension  I10     2. Hypertensive heart disease without heart failure  I11.9        No orders of the defined types were placed in this encounter.   No orders of the defined types were placed in this encounter.   There are no discontinued medications.    Recommendations:   Amy Wheeler is a 52 y.o. African-American female patient with hypertension, GERD, hypothyroidism, mild OSA now using CPAP regularly.  Presents for follow-up of hypertension.  1. Primary hypertension Patient's blood pressure has significantly improved since being on clonidine patch, she has not had any side effects of clonidine, hence continue the same.  She needs better improvement in diastolic blood pressure however she is trying her best to lose weight as well and she will soon be starting minoxidil for alopecia areata, suspect this will also help with reducing her blood pressure.  She is on Tribenzor at the maximum dose, continue the same for now.  2. Hypertensive heart disease without heart failure Since her risk factors are well-controlled, blood pressure is well-controlled now, I will see her back on a as needed basis.  Certainly weight loss will help with blood pressure control as well.  She will call me if needed.    Yates Decamp, MD, [facility] 09/26/2022, 4:12 PM Office: [phone]

## 2022-11-26 ENCOUNTER — Encounter: Payer: Self-pay | Admitting: Adult Health

## 2022-11-26 ENCOUNTER — Ambulatory Visit (INDEPENDENT_AMBULATORY_CARE_PROVIDER_SITE_OTHER): Payer: 59 | Admitting: Adult Health

## 2022-11-26 VITALS — BP 118/88 | HR 75 | Ht 63.5 in | Wt 183.2 lb

## 2022-11-26 DIAGNOSIS — G4733 Obstructive sleep apnea (adult) (pediatric): Secondary | ICD-10-CM | POA: Diagnosis not present

## 2022-11-26 NOTE — Progress Notes (Signed)
@Patient  ID: [name], female    DOB: 1970-07-22, 52 y.o.   MRN: [id number]  Chief Complaint  Patient presents with   Follow-up    Referring provider: Dorisann Frames, MD  HPI: 52 year old female followed for obstructive sleep apnea  TEST/EVENTS :  Home sleep study March 07, 2022-Mild obstructive sleep apnea Mild oxygen desaturations, Study may have underestimated sleep apnea severity with evidence of  sensor malpositioning  11/26/2022 Follow up ; OSA  Patient presents for 59-month follow-up.  Patient has mild obstructive sleep apnea that was diagnosed in February 2024.  She is on CPAP at bedtime.  Feels that she is doing well on CPAP.  Feels that she benefits from CPAP with decreased daytime sleepiness.  CPAP download shows excellent compliance with daily average usage at 6.5 hours.  Patient is on auto CPAP 5 to 10 cm H2O.  AHI 1.3/hour. Using dream wear nasal mask.     Allergies  Allergen Reactions   Bystolic [Nebivolol Hcl] Other (See Comments)    Nail change   Hydrocodone Itching    Liquid Solution    Immunization History  Administered Date(s) Administered   Janssen (J&J) SARS-COV-2 Vaccination 03/22/2019    Past Medical History:  Diagnosis Date   Graves disease    Hypertension     Tobacco History: Social History   Tobacco Use  Smoking Status Never  Smokeless Tobacco Never   Counseling given: Not Answered   Outpatient Medications Prior to Visit  Medication Sig Dispense Refill   BLACK CURRANT SEED OIL PO Take by mouth.     clobetasol ointment (TEMOVATE) 0.05 % Apply 1 Application topically 2 (two) times daily.     cloNIDine (CATAPRES - DOSED IN MG/24 HR) 0.2 mg/24hr patch Place 1 patch (0.2 mg total) onto the skin once a week. 12 patch 1   COD LIVER OIL PO Take by mouth daily.     minoxidil (LONITEN) 2.5 MG tablet Take 2.5 mg by mouth daily.     NON FORMULARY Take 1 tablet by mouth daily. ARJUNA     Olmesartan-amLODIPine-HCTZ (TRIBENZOR) 40-10-25  MG TABS Take 1 tablet by mouth every morning. 90 tablet 1   Vitamin D, Ergocalciferol, (DRISDOL) 1.25 MG (50000 UNIT) CAPS capsule Take 50,000 Units by mouth once a week.     No facility-administered medications prior to visit.     Review of Systems:   Constitutional:   No  weight loss, night sweats,  Fevers, chills, fatigue, or  lassitude.  HEENT:   No headaches,  Difficulty swallowing,  Tooth/dental problems, or  Sore throat,                No sneezing, itching, ear ache, nasal congestion, post nasal drip,   CV:  No chest pain,  Orthopnea, PND, swelling in lower extremities, anasarca, dizziness, palpitations, syncope.   GI  No heartburn, indigestion, abdominal pain, nausea, vomiting, diarrhea, change in bowel habits, loss of appetite, bloody stools.   Resp: No shortness of breath with exertion or at rest.  No excess mucus, no productive cough,  No non-productive cough,  No coughing up of blood.  No change in color of mucus.  No wheezing.  No chest wall deformity  Skin: no rash or lesions.  GU: no dysuria, change in color of urine, no urgency or frequency.  No flank pain, no hematuria   MS:  No joint pain or swelling.  No decreased range of motion.  No back pain.    Physical Exam  BP 118/88 (BP Location: Left Arm, Patient Position: Sitting, Cuff Size: Large)   Pulse 75   Ht 5' 3.5" (1.613 m)   Wt 183 lb 3.2 oz (83.1 kg)   SpO2 100%   BMI 31.94 kg/m   GEN: A/Ox3; pleasant , NAD, well nourished    HEENT:  [place]/AT,  NOSE-clear, THROAT-clear, no lesions, no postnasal drip or exudate noted.  Class 3 MP airway  NECK:  Supple w/ fair ROM; no JVD; normal carotid impulses w/o bruits; no thyromegaly or nodules palpated; no lymphadenopathy.    RESP  Clear  P & A; w/o, wheezes/ rales/ or rhonchi. no accessory muscle use, no dullness to percussion  CARD:  RRR, no m/r/g, no peripheral edema, pulses intact, no cyanosis or clubbing.  GI:   Soft & nt; nml bowel sounds; no organomegaly  or masses detected.   Musco: Warm bil, no deformities or joint swelling noted.   Neuro: alert, no focal deficits noted.    Skin: Warm, no lesions or rashes    Lab Results:  CBC   BMET   BNP No results found for: "BNP"  ProBNP No results found for: "PROBNP"  Imaging: No results found.  Administration History     None           No data to display          No results found for: "NITRICOXIDE"      Assessment & Plan:   OSA (obstructive sleep apnea) Excellent control and compliance on CPAP.  Patient is continue on current settings.  Patient education on sleep apnea and CPAP care  Plan  Patient Instructions  Continue on CPAP At bedtime , wear all night long for at least 6hr  Work on healthy weight loss  Do not drive if sleepy  Follow up in 1 year with Dr. Wynona Neat or Enmanuel Zufall NP and As needed        Rubye Oaks, NP 11/26/2022

## 2022-11-26 NOTE — Assessment & Plan Note (Signed)
Excellent control and compliance on CPAP.  Patient is continue on current settings.  Patient education on sleep apnea and CPAP care  Plan  Patient Instructions  Continue on CPAP At bedtime , wear all night long for at least 6hr  Work on healthy weight loss  Do not drive if sleepy  Follow up in 1 year with Dr. Wynona Neat or Stesha Neyens NP and As needed

## 2022-11-26 NOTE — Patient Instructions (Addendum)
Continue on CPAP At bedtime , wear all night long for at least 6hr  Work on healthy weight loss  Do not drive if sleepy  Follow up in 1 year with Dr. Wynona Neat or Leoda Smithhart NP and As needed

## 2022-12-16 ENCOUNTER — Other Ambulatory Visit: Payer: Self-pay | Admitting: Cardiology

## 2022-12-16 DIAGNOSIS — I1A0 Resistant hypertension: Secondary | ICD-10-CM

## 2022-12-18 ENCOUNTER — Other Ambulatory Visit: Payer: Self-pay | Admitting: Cardiology

## 2022-12-18 DIAGNOSIS — I1A0 Resistant hypertension: Secondary | ICD-10-CM

## 2023-04-13 ENCOUNTER — Ambulatory Visit
Admission: RE | Admit: 2023-04-13 | Discharge: 2023-04-13 | Disposition: A | Discharge status: Home / Self Care | Source: Ambulatory Visit | Attending: Family Medicine | Admitting: Family Medicine

## 2023-04-13 ENCOUNTER — Other Ambulatory Visit: Payer: Self-pay

## 2023-04-13 VITALS — BP 158/97 | HR 76 | Temp 98.1°F | Resp 17

## 2023-04-13 DIAGNOSIS — R220 Localized swelling, mass and lump, head: Secondary | ICD-10-CM

## 2023-04-13 DIAGNOSIS — R21 Rash and other nonspecific skin eruption: Secondary | ICD-10-CM

## 2023-04-13 MED ORDER — METHYLPREDNISOLONE ACETATE 80 MG/ML IJ SUSP
80.0000 mg | Freq: Once | INTRAMUSCULAR | Status: AC
  Administered 2023-04-13: 80 mg via INTRAMUSCULAR

## 2023-04-13 MED ORDER — PREDNISONE 10 MG (21) PO TBPK: ORAL_TABLET | Freq: Every day | ORAL | 0 refills | Status: AC

## 2023-04-13 NOTE — Discharge Instructions (Addendum)
 Advised patient to take medication as directed with food to completion.  Encouraged increase daily water intake to 64 ounces per day while taking these medications.  Advised if symptoms worsen and/or unresolved please follow-up with your PCP, dermatology (contact information provided with this AVS today) or here for further evaluation.

## 2023-04-13 NOTE — ED Triage Notes (Signed)
 Pt c/o facial itching/swelling x 2 months. Saw derm a few weeks ago. Given dapsone gel 7.5% with no relief. Hx of rosacea. Benedryl last night.

## 2023-04-13 NOTE — ED Provider Notes (Signed)
 [facility]    CSN: [id number] Arrival date & time: 04/13/23  1246      History   Chief Complaint Chief Complaint  Patient presents with   Allergic Reaction    Facial itching/swelling    HPI Amy Wheeler is a 53 y.o. female.   HPI 53 year old female presents with possible allergic reaction reports facial itching and swelling for 2 months.  Patient reports was evaluated by dermatology 3 weeks ago and given dapsone gel 7.5% with no relief.  Patient reports history of rosacea.  PMH significant for obesity, Graves' disease, and HTN.  Past Medical History:  Diagnosis Date   Graves disease    Hypertension     Patient Active Problem List   Diagnosis Date Noted   OSA (obstructive sleep apnea) 03/23/2022   Snoring 11/30/2021   Palpitations 11/30/2020   Angioedema 11/10/2017   Primary hypertension 05/25/2014    Past Surgical History:  Procedure Laterality Date   BREAST REDUCTION SURGERY     DILATION AND CURETTAGE OF UTERUS      OB History   No obstetric history on file.      Home Medications    Prior to Admission medications   Medication Sig Start Date End Date Taking? Authorizing Provider  predniSONE (STERAPRED UNI-PAK 21 TAB) 10 MG (21) TBPK tablet Take by mouth daily. Take 6 tabs by mouth daily  for 2 days, then 5 tabs for 2 days, then 4 tabs for 2 days, then 3 tabs for 2 days, 2 tabs for 2 days, then 1 tab by mouth daily for 2 days 04/13/23  Yes Trevor Iha, FNP  BLACK CURRANT SEED OIL PO Take by mouth.    [provider]  clobetasol ointment (TEMOVATE) 0.05 % Apply 1 Application topically 2 (two) times daily. 07/30/22   [provider]  cloNIDine (CATAPRES - DOSED IN MG/24 HR) 0.2 mg/24hr patch PLACE 1 PATCH (0.2 MG TOTAL) ONTO THE SKIN ONCE A WEEK. 12/17/22   Yates Decamp, MD  COD LIVER OIL PO Take by mouth daily.    [provider]  minoxidil (LONITEN) 2.5 MG tablet Take 2.5 mg by mouth daily.    [provider]   NON FORMULARY Take 1 tablet by mouth daily. ARJUNA    [provider]  Olmesartan-amLODIPine-HCTZ 40-10-25 MG TABS TAKE 1 TABLET BY MOUTH EVERY DAY IN THE MORNING 12/19/22   Yates Decamp, MD  Vitamin D, Ergocalciferol, (DRISDOL) 1.25 MG (50000 UNIT) CAPS capsule Take 50,000 Units by mouth once a week. 07/31/22   [provider]    Family History Family History  Problem Relation Age of Onset   Hypertension Mother     Social History Social History   Tobacco Use   Smoking status: Never   Smokeless tobacco: Never  Vaping Use   Vaping status: Never Used  Substance Use Topics   Alcohol use: Yes    Comment: rarely   Drug use: No     Allergies   Bystolic [nebivolol hcl] and Hydrocodone   Review of Systems Review of Systems   Physical Exam Triage Vital Signs ED Triage Vitals  Encounter Vitals Group     BP      Systolic BP Percentile      Diastolic BP Percentile      Pulse      Resp      Temp      Temp src      SpO2      Weight  Height      Head Circumference      Peak Flow      Pain Score      Pain Loc      Pain Education      Exclude from Growth Chart    No data found.  Updated Vital Signs BP (!) 158/97 (BP Location: Right Arm)   Pulse 76   Temp 98.1 F (36.7 C) (Oral)   Resp 17   SpO2 98%    Physical Exam Vitals and nursing note reviewed.  Constitutional:      Appearance: Normal appearance. She is obese.  HENT:     Head: Normocephalic and atraumatic.     Mouth/Throat:     Mouth: Mucous membranes are moist.     Pharynx: Oropharynx is clear.  Eyes:     Extraocular Movements: Extraocular movements intact.     Conjunctiva/sclera: Conjunctivae normal.     Pupils: Pupils are equal, round, and reactive to light.  Cardiovascular:     Rate and Rhythm: Normal rate and regular rhythm.     Pulses: Normal pulses.     Heart sounds: Normal heart sounds.  Pulmonary:     Effort: Pulmonary effort is normal.     Breath sounds: Normal  breath sounds. No wheezing, rhonchi or rales.  Musculoskeletal:        General: Normal range of motion.     Cervical back: Normal range of motion and neck supple.  Skin:    General: Skin is warm and dry.     Comments: Face: Swelling noted over her frontal lobe and bilateral inferior orbits with surrounding erythematous maculopapular eruptions-please see image below  Neurological:     General: No focal deficit present.     Mental Status: She is alert and oriented to person, place, and time. Mental status is at baseline.  Psychiatric:        Mood and Affect: Mood normal.        Behavior: Behavior normal.        Thought Content: Thought content normal.      UC Treatments / Results  Labs (all labs ordered are listed, but only abnormal results are displayed) Labs Reviewed - No data to display  EKG   Radiology No results found.  Procedures Procedures (including critical care time)  Medications Ordered in UC Medications  methylPREDNISolone acetate (DEPO-MEDROL) injection 80 mg (80 mg Intramuscular Given 04/13/23 1319)    Initial Impression / Assessment and Plan / UC Course  I have reviewed the triage vital signs and the nursing notes.  Pertinent labs & imaging results that were available during my care of the patient were reviewed by me and considered in my medical decision making (see chart for details).     MDM: 1.  Facial swelling-IM Depo-Medrol given once in clinic and prior to discharge; 2.  Rash and nonspecific skin eruption-Rx'd Sterapred Unipak (tapering from 60 mg to 10 mg over 10 days). Advised patient to take medication as directed with food to completion.  Encouraged increase daily water intake to 64 ounces per day while taking these medications.  Advised if symptoms worsen and/or unresolved please follow-up with your PCP, dermatology (contact information provided with this AVS today) or here for further evaluation.  Patient discharged home, hemodynamically  stable. Final Clinical Impressions(s) / UC Diagnoses   Final diagnoses:  Rash and nonspecific skin eruption  Facial swelling     Discharge Instructions      Advised patient to take  medication as directed with food to completion.  Encouraged increase daily water intake to 64 ounces per day while taking these medications.  Advised if symptoms worsen and/or unresolved please follow-up with your PCP, dermatology (contact information provided with this AVS today) or here for further evaluation.     ED Prescriptions     Medication Sig Dispense Auth. Provider   predniSONE (STERAPRED UNI-PAK 21 TAB) 10 MG (21) TBPK tablet Take by mouth daily. Take 6 tabs by mouth daily  for 2 days, then 5 tabs for 2 days, then 4 tabs for 2 days, then 3 tabs for 2 days, 2 tabs for 2 days, then 1 tab by mouth daily for 2 days 42 tablet Trevor Iha, FNP      PDMP not reviewed this encounter.   Trevor Iha, FNP 04/13/23 1323

## 2023-06-05 ENCOUNTER — Other Ambulatory Visit (HOSPITAL_COMMUNITY): Payer: Self-pay

## 2023-06-05 ENCOUNTER — Other Ambulatory Visit: Payer: Self-pay

## 2023-06-05 DIAGNOSIS — I1A0 Resistant hypertension: Secondary | ICD-10-CM

## 2023-06-05 MED ORDER — CLONIDINE 0.2 MG/24HR TD PTWK
0.2000 mg | MEDICATED_PATCH | TRANSDERMAL | 0 refills | Status: DC
  Filled 2023-06-05: qty 12, 84d supply, fill #0

## 2023-06-10 ENCOUNTER — Other Ambulatory Visit (HOSPITAL_COMMUNITY): Payer: Self-pay

## 2023-08-07 ENCOUNTER — Ambulatory Visit (INDEPENDENT_AMBULATORY_CARE_PROVIDER_SITE_OTHER): Admitting: Dermatology

## 2023-08-07 ENCOUNTER — Encounter: Payer: Self-pay | Admitting: Dermatology

## 2023-08-07 VITALS — BP 116/77 | HR 70

## 2023-08-07 DIAGNOSIS — L6681 Central centrifugal cicatricial alopecia: Secondary | ICD-10-CM

## 2023-08-07 DIAGNOSIS — L719 Rosacea, unspecified: Secondary | ICD-10-CM

## 2023-08-07 MED ORDER — SAFETY SEAL MISCELLANEOUS MISC: 1.0000 | Freq: Every morning | 11 refills | Status: DC

## 2023-08-07 NOTE — Patient Instructions (Addendum)
 Date: Wed Aug 07 2023  Hello Amy Wheeler ,  Thank you for visiting today. Here is a summary of the key instructions:  - Medications: Use compound with clobetasol, minoxidil 8%, and finasteride 1% in the morning   - Apply a thin layer   - Wash hands after application  - Skin Care:   - Continue using ivermectin, metronidazole, and azelaic acid at night for rosacea   - Use CeraVe face wash   - Apply La Roche-Posay moisturizing sunscreen  - Supplements (optional):   - Consider taking Viviscal (2 tablets daily)   - Collagen supplements may be beneficial  - Follow-up: Return for a follow-up appointment in 4 months  - Additional Instructions:   - Contact us  if you need more of the compound medication   - An after-visit summary will be provided as a handout  Please reach out if you have any questions or concerns.  Warm regards,  Dr. Delon [facility]is [facility]    To schedule an appointment with Dr. Odella at [facility] in [place], [place], call [phone]. Dr. Odella also sees patients at [facility], where appointments with him can be scheduled at [phone] or [phone].  Your provider has sent your prescription to [facility] in [place], [place] . A pharmacy representative will call you to confirm details and take your payment information. If you do not receive a call within 24 hours, please contact the pharmacy at [phone] or [phone]. Your unique skincare compound is being formulated in our lab (most compounds take less than 24 hours). Your prescription is shipped vis USPS to your mailbox (2-4 business days). Priority shipping is available at an additional cost. Once received, you will electronically sign/acknowledge that you received your prescription. The pharmacy hours are Monday-Friday 9 am-6 pm EST and Saturday 9 am-1 pm EST.      Important Information   Due to recent changes in healthcare laws, you may see results of your  pathology and/or laboratory studies on MyChart before the doctors have had a chance to review them. We understand that in some cases there may be results that are confusing or concerning to you. Please understand that not all results are received at the same time and often the doctors may need to interpret multiple results in order to provide you with the best plan of care or course of treatment. Therefore, we ask that you please give us  2 business days to thoroughly review all your results before contacting the office for clarification. Should we see a critical lab result, you will be contacted sooner.     If You Need Anything After Your Visit   If you have any questions or concerns for your doctor, please call our main line at [phone]. If no one answers, please leave a voicemail as directed and we will return your call as soon as possible. Messages left after 4 pm will be answered the following business day.    You may also send us  a message via MyChart. We typically respond to MyChart messages within 1-2 business days.  For prescription refills, please ask your pharmacy to contact our office. Our fax number is [phone].  If you have an urgent issue when the clinic is closed that cannot wait until the next business day, you can page your doctor at the number below.     Please note that while we do our best to be available for urgent issues outside of office hours, we are not available 24/7.  If you have an urgent issue and are unable to reach us , you may choose to seek medical care at your doctor's office, retail clinic, urgent care center, or emergency room.   If you have a medical emergency, please immediately call 911 or go to the emergency department. In the event of inclement weather, please call our main line at [phone] for an update on the status of any delays or closures.  Dermatology Medication Tips: Please keep the boxes that topical medications come in in order to  help keep track of the instructions about where and how to use these. Pharmacies typically print the medication instructions only on the boxes and not directly on the medication tubes.   If your medication is too expensive, please contact our office at [phone] or send us  a message through MyChart.    We are unable to tell what your co-pay for medications will be in advance as this is different depending on your insurance coverage. However, we may be able to find a substitute medication at lower cost or fill out paperwork to get insurance to cover a needed medication.    If a prior authorization is required to get your medication covered by your insurance company, please allow us  1-2 business days to complete this process.   Drug prices often vary depending on where the prescription is filled and some pharmacies may offer cheaper prices.   The website www.goodrx.com contains coupons for medications through different pharmacies. The prices here do not account for what the cost may be with help from insurance (it may be cheaper with your insurance), but the website can give you the price if you did not use any insurance.  - You can print the associated coupon and take it with your prescription to the pharmacy.  - You may also stop by our office during regular business hours and pick up a GoodRx coupon card.  - If you need your prescription sent electronically to a different pharmacy, notify our office through [facility] or by phone at [phone]

## 2023-08-07 NOTE — Progress Notes (Unsigned)
   New Patient Visit   Subjective  Amy Wheeler is a 53 y.o. female who presents for the following: Hair Thinning/loss and rosacea  Patient states she has hair loss located at the scalp that she would like to have examined. Patient reports the areas have been there for 15 years. She reports the areas were bothersome. She reports prior to treatments from Dr. Mollie (ILK Injections and clobetasol ointment). Patient reports she has previously been treated for these areas. She was also provided a compound that worked very well for her but she is unsure of the name. She is also doing scalp massages almost daily along with Wild growth oil and Nutrofol Hair Serum.  Patient states she has rosacea located at the face that she would like to have examined. Patient reports the areas have been there for several years. She reports the areas are bothersome. She reports the face became very itchy and red. Patient reports she has previously been treated for these areas.She was prescribed a compound medication with Ivermectin, Metronidazole and azelaic that worked very well.   The following portions of the chart were reviewed this encounter and updated as appropriate: medications, allergies, medical history  Review of Systems:  No other skin or systemic complaints except as noted in HPI or Assessment and Plan.  Objective  Well appearing patient in no apparent distress; mood and affect are within normal limits.   A focused examination was performed of the following areas: Face and Scalp  Relevant exam findings are noted in the Assessment and Plan.                        Assessment & Plan   Central centrifugal cicatricial alopecia (CCCA)  Exam: Scarring alopecia characterized by patches of permanent hair loss that manifest on the vertex or crown of the scalp, progressively spreading outward in a centrifugal pattern  Treatment Plan: - Prescribed Hormonic Hair Solution to [facility], to apply in the  morning to prevent hair growth on the face - Plan to follow up in 4 months to re-assess  ROSACEA Exam:  Mid face erythema with telangiectasias - scattered inflammatory papules  Not at goal  Rosacea is a chronic progressive skin condition usually affecting the face of adults, causing redness and/or acne bumps. It is treatable but not curable. It sometimes affects the eyes (ocular rosacea) as well. It may respond to topical and/or systemic medication and can flare with stress, sun exposure, alcohol, exercise, topical steroids (including hydrocortisone /cortisone 10) and some foods.  Daily application of broad spectrum spf 30+ sunscreen to face is recommended to reduce flares.  Patient denies grittiness of the eyes  Treatment Plan: - Recommended to continue daily usage of the compound med Ivermectin, Metronidazole and azelaic, Advised to contact our office when refills are needed so we can send to [facility] - Plan to follow up in 4 months to re-assess  CENTRAL CENTRIFUGAL CICATRICIAL ALOPECIA   Related Medications Safety Seal Miscellaneous MISC Apply 1 Application topically in the morning. Medication Name:Hormonic Hair Solution (Minoxidil 8%, Finasteride 1%, Clobetasol 0.05%)  Return in about 4 months (around 12/08/2023) for CCCA and Rosacea F/U.  I, Jetta Ager, am acting as Neurosurgeon for [employer], DO.  Documentation: I have reviewed the above documentation for accuracy and completeness, and I agree with the above.  Delon Lenis, DO

## 2023-11-15 ENCOUNTER — Other Ambulatory Visit: Payer: Self-pay | Admitting: Cardiology

## 2023-11-15 DIAGNOSIS — I1A0 Resistant hypertension: Secondary | ICD-10-CM

## 2023-11-19 MED ORDER — OLMESARTAN-AMLODIPINE-HCTZ 40-10-25 MG PO TABS: 1.0000 | ORAL_TABLET | Freq: Every morning | ORAL | 0 refills | Status: AC

## 2023-11-26 ENCOUNTER — Encounter: Payer: Self-pay | Admitting: Adult Health

## 2023-11-26 ENCOUNTER — Ambulatory Visit: Payer: 59 | Admitting: Adult Health

## 2023-11-26 VITALS — BP 158/97 | HR 68 | Temp 97.7°F | Ht 63.5 in | Wt 180.6 lb

## 2023-11-26 DIAGNOSIS — I10 Essential (primary) hypertension: Secondary | ICD-10-CM | POA: Diagnosis not present

## 2023-11-26 DIAGNOSIS — Z6831 Body mass index (BMI) 31.0-31.9, adult: Secondary | ICD-10-CM | POA: Diagnosis not present

## 2023-11-26 DIAGNOSIS — G4733 Obstructive sleep apnea (adult) (pediatric): Secondary | ICD-10-CM

## 2023-11-26 NOTE — Patient Instructions (Signed)
 Continue on CPAP At bedtime , wear all night long for at least 6hr  Work on healthy weight loss  Do not drive if sleepy  Follow up in 1 year with Dr. Wynona Neat or Leoda Smithhart NP and As needed

## 2023-11-26 NOTE — Progress Notes (Signed)
 @Patient  ID: Amy Wheeler, female    DOB: 18-Aug-1970, 53 y.o.   MRN: [id number]  Chief Complaint  Patient presents with   Obstructive Sleep Apnea    F/U    Referring provider: Tommas Pears, MD  HPI: 53 year old female followed for obstructive sleep apnea on nocturnal CPAP    TEST/EVENTS : Reviewed 11/26/2023  Home sleep study March 07, 2022-Mild obstructive sleep apnea Mild oxygen desaturations, Study may have underestimated sleep apnea severity with evidence of  sensor malpositioning   Discussed the use of AI scribe software for clinical note transcription with the patient, who gave verbal consent to proceed.  History of Present Illness Amy Wheeler is a 53 year old female with sleep apnea who presents for a routine follow-up regarding CPAP use.  She experienced a whistling sound in her ear, initially thought to be due to a hole in the equipment, but later realized it was because she had not pulled the headgear tighter.  Once corrected, the issue resolved, and she resumed using the CPAP without further problems.  She uses a nasal mask and obtains her durable medical equipment from [employer]. She uses distilled water in the CPAP machine and cleans the equipment with vinegar or soapy water. She has been using the CPAP machine consistently. The first part of the last month was not as consistent due to air leaking. She feels she benefits from CPAP.  CPAP download shows good compliance with daily average usage at 5.5 hours.  She is on auto CPAP 5 to 10 cm H2O.  AHI 1.9/hour daily average pressure at 9.1 cm H2O.  In terms of physical activity, she has been active, previously working out with weights three times a week since 2018. However, she has reduced the frequency due to not wanting to bulk up and now exercises every other Thursday. She finds it challenging to maintain a routine but is motivated to stay active.  She is planning to go on a cruise and inquires about using water  for her CPAP if distilled water is unavailable during her trip.     Allergies  Allergen Reactions   Bystolic  [Nebivolol  Hcl] Other (See Comments)    Nail change   Hydrocodone  Itching    Liquid Solution    Immunization History  Administered Date(s) Administered   Janssen (J&J) SARS-COV-2 Vaccination 03/22/2019    Past Medical History:  Diagnosis Date   Graves disease    Hypertension     Tobacco History: Social History   Tobacco Use  Smoking Status Never  Smokeless Tobacco Never   Counseling given: Not Answered   Outpatient Medications Prior to Visit  Medication Sig Dispense Refill   BLACK CURRANT SEED OIL PO Take by mouth.     cloNIDine  (CATAPRES  - DOSED IN MG/24 HR) 0.2 mg/24hr patch Place 1 patch (0.2 mg total) onto the skin once a week. 12 patch 0   COD LIVER OIL PO Take by mouth daily. (Patient taking differently: Take by mouth daily.)     NON FORMULARY Take 1 tablet by mouth daily. ARJUNA     Olmesartan -amLODIPine -HCTZ 40-10-25 MG TABS Take 1 tablet by mouth every morning. 30 tablet 0   Safety Seal Miscellaneous MISC Apply 1 Application topically in the morning. Medication Name:Hormonic Hair Solution (Minoxidil 8%, Finasteride 1%, Clobetasol 0.05%) 30 mL 11   Vitamin D, Ergocalciferol, (DRISDOL) 1.25 MG (50000 UNIT) CAPS capsule Take 50,000 Units by mouth once a week.     clobetasol ointment (TEMOVATE) 0.05 % Apply  1 Application topically 2 (two) times daily.     minoxidil (LONITEN) 2.5 MG tablet Take 2.5 mg by mouth daily. (Patient not taking: Reported on 08/07/2023)     predniSONE  (STERAPRED UNI-PAK 21 TAB) 10 MG (21) TBPK tablet Take by mouth daily. Take 6 tabs by mouth daily  for 2 days, then 5 tabs for 2 days, then 4 tabs for 2 days, then 3 tabs for 2 days, 2 tabs for 2 days, then 1 tab by mouth daily for 2 days (Patient not taking: Reported on 08/07/2023) 42 tablet 0   No facility-administered medications prior to visit.     Review of Systems:    Constitutional:   No  weight loss, night sweats,  Fevers, chills, fatigue, or  lassitude.  HEENT:   No headaches,  Difficulty swallowing,  Tooth/dental problems, or  Sore throat,                No sneezing, itching, ear ache, nasal congestion, post nasal drip,   CV:  No chest pain,  Orthopnea, PND, swelling in lower extremities, anasarca, dizziness, palpitations, syncope.   GI  No heartburn, indigestion, abdominal pain, nausea, vomiting, diarrhea, change in bowel habits, loss of appetite, bloody stools.   Resp: No shortness of breath with exertion or at rest.  No excess mucus, no productive cough,  No non-productive cough,  No coughing up of blood.  No change in color of mucus.  No wheezing.  No chest wall deformity  Skin: no rash or lesions.  GU: no dysuria, change in color of urine, no urgency or frequency.  No flank pain, no hematuria   MS:  No joint pain or swelling.  No decreased range of motion.  No back pain.    Physical Exam  BP (!) 158/97   Pulse 68   Temp 97.7 F (36.5 C)   Ht 5' 3.5 (1.613 m) Comment: Per pt  Wt 180 lb 9.6 oz (81.9 kg)   SpO2 97% Comment: RA  BMI 31.49 kg/m   GEN: A/Ox3; pleasant , NAD, well nourished    HEENT:  [place]/AT,    NOSE-clear, THROAT-clear, no lesions, no postnasal drip or exudate noted.   NECK:  Supple w/ fair ROM; no JVD; normal carotid impulses w/o bruits; no thyromegaly or nodules palpated; no lymphadenopathy.    RESP  Clear  P & A; w/o, wheezes/ rales/ or rhonchi. no accessory muscle use, no dullness to percussion  CARD:  RRR, no m/r/g, no peripheral edema, pulses intact, no cyanosis or clubbing.  GI:   Soft & nt; nml bowel sounds; no organomegaly or masses detected.   Musco: Warm bil, no deformities or joint swelling noted.   Neuro: alert, no focal deficits noted.    Skin: Warm, no lesions or rashes    Lab Results:Reviewed 11/26/2023   CBC   BNP No results found for: BNP  ProBNP No results found for:  PROBNP  Imaging: No results found.  Administration History     None           No data to display          No results found for: NITRICOXIDE      No data to display              Assessment & Plan:   Assessment and Plan Assessment & Plan Obstructive sleep apnea  -excellent control with perceived benefit Obstructive sleep apnea is well-controlled with CPAP therapy. She reports no major issues with  CPAP use, aside from a temporary whistling sound due to improper setup, which she resolved. Compliance is good, with a goal of six or more hours per night using a nasal mask. She obtains DME supplies from [employer] [facility] and uses distilled water for the humidifier, understanding the importance of keeping the machine clean. During travel, she will use bottled water if distilled water is unavailable. Regular physical activity, including 30 minutes of exercise daily, is beneficial for her condition. Continue CPAP therapy with a follow-up scheduled in one year.    BMI 31- -weight is stable continue with healthy weight loss and exercise  HTN-blood pressure is elevated.  Continue follow-up with primary care.  Continue on current regimen.    Madelin Stank, NP 11/26/2023

## 2023-12-20 ENCOUNTER — Other Ambulatory Visit: Payer: Self-pay | Admitting: Cardiology

## 2023-12-20 ENCOUNTER — Other Ambulatory Visit (HOSPITAL_COMMUNITY): Payer: Self-pay

## 2023-12-20 DIAGNOSIS — I1A0 Resistant hypertension: Secondary | ICD-10-CM

## 2023-12-23 ENCOUNTER — Other Ambulatory Visit: Payer: Self-pay | Admitting: Cardiology

## 2023-12-23 ENCOUNTER — Other Ambulatory Visit (HOSPITAL_COMMUNITY): Payer: Self-pay

## 2023-12-23 DIAGNOSIS — I1A0 Resistant hypertension: Secondary | ICD-10-CM

## 2023-12-23 MED ORDER — CLONIDINE 0.2 MG/24HR TD PTWK
0.2000 mg | MEDICATED_PATCH | TRANSDERMAL | 0 refills | Status: AC
  Filled 2023-12-23: qty 4, 28d supply, fill #0

## 2023-12-24 ENCOUNTER — Ambulatory Visit: Admitting: Dermatology

## 2023-12-26 ENCOUNTER — Ambulatory Visit (INDEPENDENT_AMBULATORY_CARE_PROVIDER_SITE_OTHER): Payer: Self-pay | Admitting: Dermatology

## 2023-12-26 ENCOUNTER — Ambulatory Visit: Payer: Self-pay | Admitting: Dermatology

## 2023-12-26 DIAGNOSIS — L6681 Central centrifugal cicatricial alopecia: Secondary | ICD-10-CM

## 2023-12-26 MED ORDER — SAFETY SEAL MISCELLANEOUS MISC: 1.0000 | Freq: Every morning | 6 refills | Status: AC

## 2023-12-26 NOTE — Patient Instructions (Signed)

## 2023-12-26 NOTE — Progress Notes (Unsigned)
   Follow-Up Visit   Subjective  Amy Wheeler is a 53 y.o. female who presents for the following: CCCA follow up - She is using clobetasol/minoxidil/finasteride compound every morning and taking [employer] Collagen Peptide powder daily. She thinks her condition may be a little better, definitely no worse. Her last office visit was 08/07/2023.  She is also following up on rosacea. She is using ivermectin/metronidazole/azelaic acid compound from [facility] in [place]. It was prescribed by another provider. She has not had any rosacea flares lately.   The following portions of the chart were reviewed this encounter and updated as appropriate: medications, allergies, medical history  Review of Systems:  No other skin or systemic complaints except as noted in HPI or Assessment and Plan.  Objective  Well appearing patient in no apparent distress; mood and affect are within normal limits.   A focused examination was performed of the following areas: Scalp, face   Relevant exam findings are noted in the Assessment and Plan.             Assessment & Plan    Central centrifugal cicatricial alopecia Improvement in hair thickness, particularly in the middle section. No further hair loss and some growth observed. Current treatment includes clobetasol, minoxidil, and finasteride. Clobetasol is used to control inflammation associated with CCCA. Minoxidil and finasteride are used to stimulate hair growth and prevent hair thinning due to hormonal changes. She is diligent with topical application and uses collagen supplements. She has also started using natural products from a specific brand that are calming for the scalp. Discussed potential for hair transplant in the future, contingent on stability of condition for at least a year. Hair transplant success rates vary, with 60% success and 40% not successful, depending on the surgeon's experience and comfort level.  -  Continue clobetasol for 6 more months to ensure inflammation is controlled. - Continue minoxidil and finasteride indefinitely. - Will discontinue clobetasol after 6 months and use as a spot treatment if tenderness or itchiness occurs. - Continue collagen supplements. - Use natural products from the specific brand as needed for scalp calming. - Sent a new script for drops with six refills. - Will discuss potential hair transplant options with specialists after stability is achieved. CENTRAL CENTRIFUGAL CICATRICIAL ALOPECIA   Related Medications Safety Seal Miscellaneous MISC Apply 1 Application topically in the morning. Medication Name:Hormonic Hair Solution (Minoxidil 8%, Finasteride 1%, Clobetasol 0.05%)  Return in about 6 months (around 06/25/2024) for CCCA.  I, Roseline Hutchinson, CMA, am acting as scribe for [employer], DO .   Documentation: I have reviewed the above documentation for accuracy and completeness, and I agree with the above.  Delon Lenis, DO

## 2023-12-30 ENCOUNTER — Encounter: Payer: Self-pay | Admitting: Dermatology
